# Patient Record
Sex: Male | Born: 1935 | Race: White | Hispanic: No | Marital: Married | Smoking: Never smoker
Health system: Southern US, Community
[De-identification: ages and names within clinical notes are randomized; demographics above are authoritative.]

## PROBLEM LIST (undated history)

## (undated) DIAGNOSIS — M545 Low back pain, unspecified: Secondary | ICD-10-CM

## (undated) DIAGNOSIS — E785 Hyperlipidemia, unspecified: Secondary | ICD-10-CM

## (undated) DIAGNOSIS — M47812 Spondylosis without myelopathy or radiculopathy, cervical region: Secondary | ICD-10-CM

## (undated) DIAGNOSIS — M47814 Spondylosis without myelopathy or radiculopathy, thoracic region: Secondary | ICD-10-CM

## (undated) DIAGNOSIS — N400 Enlarged prostate without lower urinary tract symptoms: Secondary | ICD-10-CM

## (undated) DIAGNOSIS — I10 Essential (primary) hypertension: Secondary | ICD-10-CM

## (undated) DIAGNOSIS — B91 Sequelae of poliomyelitis: Secondary | ICD-10-CM

## (undated) DIAGNOSIS — E039 Hypothyroidism, unspecified: Secondary | ICD-10-CM

## (undated) HISTORY — DX: Spondylosis without myelopathy or radiculopathy, thoracic region: M47.814

## (undated) HISTORY — DX: Low back pain: M54.5

## (undated) HISTORY — DX: Sequelae of poliomyelitis: B91

## (undated) HISTORY — DX: Low back pain, unspecified: M54.50

## (undated) HISTORY — DX: Spondylosis without myelopathy or radiculopathy, cervical region: M47.812

---

## 1998-10-06 ENCOUNTER — Emergency Department (HOSPITAL_COMMUNITY): Admission: EM | Admit: 1998-10-06 | Discharge: 1998-10-06 | Payer: Self-pay | Admitting: Emergency Medicine

## 1998-11-24 ENCOUNTER — Encounter (INDEPENDENT_AMBULATORY_CARE_PROVIDER_SITE_OTHER): Payer: Self-pay | Admitting: Specialist

## 1998-11-24 ENCOUNTER — Ambulatory Visit (HOSPITAL_COMMUNITY): Admission: RE | Admit: 1998-11-24 | Discharge: 1998-11-24 | Payer: Self-pay | Admitting: Urology

## 2009-02-10 ENCOUNTER — Encounter: Admission: RE | Admit: 2009-02-10 | Discharge: 2009-02-10 | Payer: Self-pay | Admitting: Internal Medicine

## 2009-08-05 ENCOUNTER — Ambulatory Visit (HOSPITAL_BASED_OUTPATIENT_CLINIC_OR_DEPARTMENT_OTHER): Admission: RE | Admit: 2009-08-05 | Discharge: 2009-08-06 | Payer: Self-pay | Admitting: Urology

## 2009-11-20 ENCOUNTER — Emergency Department (HOSPITAL_COMMUNITY): Admission: EM | Admit: 2009-11-20 | Discharge: 2009-11-20 | Payer: Self-pay | Admitting: Emergency Medicine

## 2009-12-19 ENCOUNTER — Ambulatory Visit (HOSPITAL_COMMUNITY): Admission: RE | Admit: 2009-12-19 | Discharge: 2009-12-20 | Payer: Self-pay | Admitting: Urology

## 2010-02-08 ENCOUNTER — Encounter: Admission: RE | Admit: 2010-02-08 | Discharge: 2010-02-08 | Payer: Self-pay | Admitting: Neurology

## 2010-05-12 ENCOUNTER — Encounter
Admission: RE | Admit: 2010-05-12 | Discharge: 2010-06-06 | Payer: Self-pay | Source: Home / Self Care | Attending: Physical Medicine & Rehabilitation | Admitting: Physical Medicine & Rehabilitation

## 2010-05-16 ENCOUNTER — Ambulatory Visit
Admission: RE | Admit: 2010-05-16 | Discharge: 2010-05-16 | Payer: Self-pay | Source: Home / Self Care | Attending: Physical Medicine & Rehabilitation | Admitting: Physical Medicine & Rehabilitation

## 2010-06-12 ENCOUNTER — Ambulatory Visit: Payer: MEDICARE | Attending: Physical Medicine & Rehabilitation

## 2010-06-12 ENCOUNTER — Encounter (HOSPITAL_BASED_OUTPATIENT_CLINIC_OR_DEPARTMENT_OTHER): Payer: MEDICARE | Admitting: Physical Medicine & Rehabilitation

## 2010-06-12 DIAGNOSIS — M47817 Spondylosis without myelopathy or radiculopathy, lumbosacral region: Secondary | ICD-10-CM | POA: Insufficient documentation

## 2010-06-12 DIAGNOSIS — Z79899 Other long term (current) drug therapy: Secondary | ICD-10-CM | POA: Insufficient documentation

## 2010-06-12 DIAGNOSIS — M545 Low back pain: Secondary | ICD-10-CM

## 2010-07-20 ENCOUNTER — Encounter (HOSPITAL_BASED_OUTPATIENT_CLINIC_OR_DEPARTMENT_OTHER): Payer: MEDICARE | Admitting: Physical Medicine & Rehabilitation

## 2010-07-20 ENCOUNTER — Encounter: Payer: MEDICARE | Attending: Physical Medicine & Rehabilitation

## 2010-07-20 DIAGNOSIS — R262 Difficulty in walking, not elsewhere classified: Secondary | ICD-10-CM | POA: Insufficient documentation

## 2010-07-20 DIAGNOSIS — M545 Low back pain, unspecified: Secondary | ICD-10-CM | POA: Insufficient documentation

## 2010-07-20 DIAGNOSIS — M47817 Spondylosis without myelopathy or radiculopathy, lumbosacral region: Secondary | ICD-10-CM

## 2010-07-21 LAB — CBC
HCT: 39.2 % (ref 39.0–52.0)
Hemoglobin: 13.3 g/dL (ref 13.0–17.0)
MCH: 30.9 pg (ref 26.0–34.0)
MCHC: 33.9 g/dL (ref 30.0–36.0)

## 2010-07-21 LAB — COMPREHENSIVE METABOLIC PANEL
ALT: 21 U/L (ref 0–53)
CO2: 33 mEq/L — ABNORMAL HIGH (ref 19–32)
Calcium: 9.6 mg/dL (ref 8.4–10.5)
Chloride: 106 mEq/L (ref 96–112)
Creatinine, Ser: 1.13 mg/dL (ref 0.4–1.5)
GFR calc non Af Amer: >60 mL/min (ref >60)
Glucose, Bld: 110 mg/dL — ABNORMAL HIGH (ref 70–99)
Sodium: 143 mEq/L (ref 135–145)
Total Bilirubin: 0.6 mg/dL (ref 0.3–1.2)

## 2010-07-24 ENCOUNTER — Ambulatory Visit: Payer: MEDICARE | Admitting: Physical Medicine & Rehabilitation

## 2010-07-26 LAB — POCT I-STAT 4, (NA,K, GLUC, HGB,HCT)
HCT: 39 % (ref 39.0–52.0)
Sodium: 141 mEq/L (ref 135–145)

## 2010-09-14 ENCOUNTER — Ambulatory Visit (HOSPITAL_BASED_OUTPATIENT_CLINIC_OR_DEPARTMENT_OTHER): Payer: MEDICARE | Admitting: Physical Medicine & Rehabilitation

## 2010-09-14 ENCOUNTER — Encounter: Payer: MEDICARE | Attending: Physical Medicine & Rehabilitation

## 2010-09-14 DIAGNOSIS — M545 Low back pain, unspecified: Secondary | ICD-10-CM | POA: Insufficient documentation

## 2010-09-14 DIAGNOSIS — R262 Difficulty in walking, not elsewhere classified: Secondary | ICD-10-CM | POA: Insufficient documentation

## 2010-09-14 DIAGNOSIS — M47817 Spondylosis without myelopathy or radiculopathy, lumbosacral region: Secondary | ICD-10-CM | POA: Insufficient documentation

## 2010-09-15 NOTE — Assessment & Plan Note (Signed)
This is a patient followed by Dr. Wynn Banker for low back pain.  He was last seen in March at which time he underwent bilateral L4-5 ramus injection with medial branch blocks.  The patient comes in today stating he has no more back pain.  He is doing well.  He does have a stiff leg and walks with somewhat of an altered gait due to, what he states what he thought was, polio as a child which caused him some stiffness in his legs but otherwise he is pain free.  The patient does not rate his pain at all today, has no problems with activities except for his gait disturbance.  He walks without assistance.  He climb steps and drives.  He works at a golf course most of the time.  Review of systems is notable for those difficulties described above and this is altered gait.  Physical exam; his blood pressure is 122/70, pulse 85, respirations 18, O2 sats 99 on room air.  His motor strength is 5/5 in lower extremities. Sensation is intact.  He is alert and oriented x3.  Constitutionally, he is within normal limits.  The patient states his only problem is that certainly when he stretches his right leg, he can feel a little bit of pain in his right buttock, but is not bothersome enough to investigate at this time.  IMPRESSION:  Lumbago with some history of radicular pain.  PLAN:  He will follow up with [place] in 3 months.  The patient is not on any medications at this time.  He states he uses some Pennsaid on his hands occasionally.  Other than that he is pain free and medicine free.  We will follow him up in 3 months.  Questions were encouraged and answered.     Cesar Alf L. Blima Dessert Electronically Signed    RLW/MedQ D:  09/14/2010 14:02:16  T:  09/15/2010 16:10:96  Job #:  [id number]

## 2010-12-05 ENCOUNTER — Ambulatory Visit (HOSPITAL_BASED_OUTPATIENT_CLINIC_OR_DEPARTMENT_OTHER): Payer: 59 | Admitting: Physical Medicine & Rehabilitation

## 2010-12-05 ENCOUNTER — Encounter: Payer: 59 | Attending: Physical Medicine & Rehabilitation

## 2010-12-05 DIAGNOSIS — M545 Low back pain, unspecified: Secondary | ICD-10-CM | POA: Insufficient documentation

## 2010-12-05 DIAGNOSIS — R262 Difficulty in walking, not elsewhere classified: Secondary | ICD-10-CM | POA: Insufficient documentation

## 2010-12-05 DIAGNOSIS — M47817 Spondylosis without myelopathy or radiculopathy, lumbosacral region: Secondary | ICD-10-CM | POA: Insufficient documentation

## 2010-12-06 NOTE — Assessment & Plan Note (Signed)
Account [id number].  This is a patient of Dr. Claudette Laws, on his scheduled day, I ended up seeing for him.  He has been seen for low back pain and has not had any new problems other than the fact that he states that he feels like he is slowing down and has some pain in his right leg from time to time, his average pain is 1.  He does not rate his activity level or indicate when the pain is better or worse.  He just states that he got an antibiotic shot in his right hip a couple of weeks ago and has had some leg pain since.  I explained to him that could be nerve aggravation.  He does walk with slight limp.  Functionality:  He is playing golf every day or least a few times a week.  REVIEW OF SYSTEMS:  Notable for difficulties described above, otherwise within normal limits.  His Oswestry score is 28.  PAST MEDICAL HISTORY, SOCIAL HISTORY, AND FAMILY HISTORY:  Unchanged.  PHYSICAL EXAMINATION:  VITAL SIGNS:  His blood pressure is 143/73.  His pulse is 73, respirations 18, and O2 sat is 97 on room air.  His motor strength 5/5 in iliopsoas, quadriceps.  Constitutionally, he is within normal limits.  He is alert and oriented x3.  ASSESSMENT:  Lumbago with history of radicular pain.  PLAN:  We will start on Mobic 15 mg one p.o. every other day p.r.n. #30, with one refill.  He knows to take this sparingly.  He will follow up with Dr. Wynn Banker for questionable injection in the next couple of months.  His questions were encouraged and answered.     Landen Knoedler L. Blima Dessert Electronically Signed    RLW/MedQ D:  12/05/2010 13:47:08  T:  12/06/2010 01:03:14  Job #:  [id number]

## 2010-12-08 ENCOUNTER — Ambulatory Visit: Payer: MEDICARE | Admitting: Physical Medicine & Rehabilitation

## 2011-01-30 ENCOUNTER — Ambulatory Visit: Payer: 59 | Admitting: Physical Medicine & Rehabilitation

## 2011-10-27 ENCOUNTER — Other Ambulatory Visit: Payer: Self-pay | Admitting: Physical Medicine & Rehabilitation

## 2014-07-08 ENCOUNTER — Emergency Department (HOSPITAL_COMMUNITY): Payer: Medicare Other

## 2014-07-08 ENCOUNTER — Encounter (HOSPITAL_COMMUNITY): Payer: Self-pay | Admitting: *Deleted

## 2014-07-08 ENCOUNTER — Emergency Department (HOSPITAL_COMMUNITY)
Admission: EM | Admit: 2014-07-08 | Discharge: 2014-07-09 | Disposition: A | Discharge status: Home / Self Care | Payer: Medicare Other | Attending: Emergency Medicine | Admitting: Emergency Medicine

## 2014-07-08 DIAGNOSIS — L03211 Cellulitis of face: Secondary | ICD-10-CM | POA: Insufficient documentation

## 2014-07-08 DIAGNOSIS — L0201 Cutaneous abscess of face: Secondary | ICD-10-CM | POA: Insufficient documentation

## 2014-07-08 DIAGNOSIS — M6281 Muscle weakness (generalized): Secondary | ICD-10-CM | POA: Diagnosis not present

## 2014-07-08 DIAGNOSIS — R531 Weakness: Secondary | ICD-10-CM | POA: Diagnosis not present

## 2014-07-08 DIAGNOSIS — R404 Transient alteration of awareness: Secondary | ICD-10-CM | POA: Diagnosis not present

## 2014-07-08 DIAGNOSIS — Z792 Long term (current) use of antibiotics: Secondary | ICD-10-CM | POA: Diagnosis not present

## 2014-07-08 DIAGNOSIS — R55 Syncope and collapse: Secondary | ICD-10-CM | POA: Diagnosis present

## 2014-07-08 DIAGNOSIS — M542 Cervicalgia: Secondary | ICD-10-CM | POA: Diagnosis not present

## 2014-07-08 DIAGNOSIS — Z79899 Other long term (current) drug therapy: Secondary | ICD-10-CM | POA: Diagnosis not present

## 2014-07-08 DIAGNOSIS — R42 Dizziness and giddiness: Secondary | ICD-10-CM | POA: Diagnosis not present

## 2014-07-08 DIAGNOSIS — R220 Localized swelling, mass and lump, head: Secondary | ICD-10-CM | POA: Diagnosis not present

## 2014-07-08 LAB — CBC WITH DIFFERENTIAL/PLATELET
BASOS ABS: 0 10*3/uL (ref 0.0–0.1)
BASOS PCT: 0 % (ref 0–1)
EOS PCT: 1 % (ref 0–5)
Eosinophils Absolute: 0.1 10*3/uL (ref 0.0–0.7)
HEMATOCRIT: 38.9 % — AB (ref 39.0–52.0)
HEMOGLOBIN: 12.8 g/dL — AB (ref 13.0–17.0)
Lymphocytes Relative: 16 % (ref 12–46)
Lymphs Abs: 1.6 10*3/uL (ref 0.7–4.0)
MCH: 30 pg (ref 26.0–34.0)
MCHC: 32.9 g/dL (ref 30.0–36.0)
MCV: 91.3 fL (ref 78.0–100.0)
MONO ABS: 0.7 10*3/uL (ref 0.1–1.0)
MONOS PCT: 7 % (ref 3–12)
Neutro Abs: 7.9 10*3/uL — ABNORMAL HIGH (ref 1.7–7.7)
Neutrophils Relative %: 76 % (ref 43–77)
Platelets: 120 10*3/uL — ABNORMAL LOW (ref 150–400)
RBC: 4.26 MIL/uL (ref 4.22–5.81)
RDW: 14.2 % (ref 11.5–15.5)
WBC: 10.2 10*3/uL (ref 4.0–10.5)

## 2014-07-08 LAB — I-STAT CHEM 8, ED
BUN: 20 mg/dL (ref 6–23)
CALCIUM ION: 1.14 mmol/L (ref 1.13–1.30)
CHLORIDE: 103 mmol/L (ref 96–112)
Creatinine, Ser: 1.1 mg/dL (ref 0.50–1.35)
GLUCOSE: 148 mg/dL — AB (ref 70–99)
HEMATOCRIT: 39 % (ref 39.0–52.0)
HEMOGLOBIN: 13.3 g/dL (ref 13.0–17.0)
Potassium: 3.8 mmol/L (ref 3.5–5.1)
Sodium: 142 mmol/L (ref 135–145)
TCO2: 23 mmol/L (ref 0–100)

## 2014-07-08 LAB — I-STAT TROPONIN, ED: Troponin i, poc: 0 ng/mL (ref 0.00–0.08)

## 2014-07-08 MED ORDER — IOHEXOL 300 MG/ML  SOLN
100.0000 mL | Freq: Once | INTRAMUSCULAR | Status: AC | PRN
Start: 2014-07-08 — End: 2014-07-08
  Administered 2014-07-08: 100 mL via INTRAVENOUS

## 2014-07-08 MED ORDER — CEPHALEXIN 500 MG PO CAPS: 500.0000 mg | ORAL_CAPSULE | Freq: Two times a day (BID) | ORAL | Status: DC

## 2014-07-08 NOTE — ED Notes (Signed)
Pt arrives to the ER via EMS for complaints of abscess to jaw and near syncope; pt was on his way in to the clinic to be evaluated for the abscess and had sudden onset of dizziness; pt was assisted by staff to chair and they called EMS; Clinic reported to EMS that pt BP was 70/30 when they evaluated him but upon EMS arrival BP was 138/ 78; pt alert and oriented; pt states that the abscess to jaw began Sat and has gotten progressively worse; pt c/o pain and discomfort to jaw radiating to neck

## 2014-07-08 NOTE — ED Notes (Signed)
Bed: WA03 Expected date:  Expected time:  Means of arrival:  Comments: EMS/abscess/syncopal episode

## 2014-07-08 NOTE — Discharge Instructions (Signed)
Cellulitis Cellulitis is an infection of the skin and the tissue beneath it. The infected area is usually red and tender. Cellulitis occurs most often in the arms and lower legs.  CAUSES  Cellulitis is caused by bacteria that enter the skin through cracks or cuts in the skin. The most common types of bacteria that cause cellulitis are staphylococci and streptococci. SIGNS AND SYMPTOMS   Redness and warmth.  Swelling.  Tenderness or pain.  Fever. DIAGNOSIS  Your health care provider can usually determine what is wrong based on a physical exam. Blood tests may also be done. TREATMENT  Treatment usually involves taking an antibiotic medicine. HOME CARE INSTRUCTIONS   Take your antibiotic medicine as directed by your health care provider. Finish the antibiotic even if you start to feel better.  Keep the infected arm or leg elevated to reduce swelling.  Apply a warm cloth to the affected area up to 4 times per day to relieve pain.  Take medicines only as directed by your health care provider.  Keep all follow-up visits as directed by your health care provider. SEEK MEDICAL CARE IF:   You notice red streaks coming from the infected area.  Your red area gets larger or turns dark in color.  Your bone or joint underneath the infected area becomes painful after the skin has healed.  Your infection returns in the same area or another area.  You notice a swollen bump in the infected area.  You develop new symptoms.  You have a fever. SEEK IMMEDIATE MEDICAL CARE IF:   You feel very sleepy.  You develop vomiting or diarrhea.  You have a general ill feeling (malaise) with muscle aches and pains. MAKE SURE YOU:   Understand these instructions.  Will watch your condition.  Will get help right away if you are not doing well or get worse. Document Released: 01/31/2005 Document Revised: 09/07/2013 Document Reviewed: 07/09/2011 [facility] Patient Information 2015 [employer], [place].  This information is not intended to replace advice given to you by your health care provider. Make sure you discuss any questions you have with your health care provider.  Apply warm compresses 3-4 times/day. Take antibiotics as prescribed and f/u with your primary care physician or ENT referral.

## 2014-07-08 NOTE — ED Provider Notes (Signed)
CSN: [id number]     Arrival date & time 07/08/14  2004 History   First MD Initiated Contact with Patient 07/08/14 2017     Chief Complaint  Patient presents with  . Abscess  . Near Syncope     (Consider location/radiation/quality/duration/timing/severity/associated sxs/prior Treatment) Patient is a 80 y.o. male presenting with abscess and near-syncope. The history is provided by the patient and the spouse. No language interpreter was used.  Abscess Associated symptoms: no fever, no headaches, no nausea and no vomiting   Near Syncope Associated symptoms include weakness. Pertinent negatives include no abdominal pain, chest pain, chills, coughing, fever, headaches, nausea, numbness or vomiting.  Mr. Deupree presents for right jaw pain and swelling that began gradually four days ago. While on the way to get his jaw treated, the patient felt weak and slumped to the ground near the car just prior to arrival.  He denies fever, chills, chest pain, dizziness, loss of consciousness, syncope, nausea, vomiting, or abdominal pain. He has applied warm compresses to the jaw area without relief.   Past Medical History  Diagnosis Date  . Late effects of acute poliomyelitis   . Lumbago   . Thoracic spondylosis without myelopathy   . Cervical spondylosis without myelopathy    History reviewed. No pertinent past surgical history. No family history on file. History  Substance Use Topics  . Smoking status: Never Smoker   . Smokeless tobacco: Not on file  . Alcohol Use: No    Review of Systems  Constitutional: Negative for fever and chills.  Respiratory: Negative for cough and chest tightness.   Cardiovascular: Positive for near-syncope. Negative for chest pain.  Gastrointestinal: Negative for nausea, vomiting and abdominal pain.  Neurological: Positive for weakness. Negative for dizziness, seizures, syncope, speech difficulty, light-headedness, numbness and headaches.  All other systems reviewed and  are negative.     Allergies  Review of patient's allergies indicates no known allergies.  Home Medications   Prior to Admission medications   Medication Sig Start Date End Date Taking? Authorizing Provider  diphenhydramine-acetaminophen (TYLENOL PM) 25-500 MG TABS Take 1 tablet by mouth at bedtime as needed (sleep).   Yes Historical Provider, MD  levothyroxine (SYNTHROID, LEVOTHROID) 100 MCG tablet Take 100 mcg by mouth daily.    Yes Historical Provider, MD  nitrofurantoin, macrocrystal-monohydrate, (MACROBID) 100 MG capsule Take 100 mg by mouth daily as needed (prevent infection).   Yes Historical Provider, MD  terazosin (HYTRIN) 2 MG capsule Take 2 mg by mouth daily.   Yes Historical Provider, MD  cephALEXin (KEFLEX) 500 MG capsule Take 1 capsule (500 mg total) by mouth 2 (two) times daily. 07/08/14   Darcey Demma Patel-Mills, PA-C   BP 144/77 mmHg  Pulse 92  Temp(Src) 98.2 F (36.8 C) (Oral)  Resp 17  SpO2 98% Physical Exam  Constitutional: He is oriented to person, place, and time. He appears well-developed and well-nourished.  HENT:  Head: Normocephalic and atraumatic.  Eyes: Conjunctivae are normal. Pupils are equal, round, and reactive to light.  Neck: Normal range of motion. Neck supple.  Cardiovascular: Normal rate, regular rhythm and normal heart sounds.   Pulmonary/Chest: Effort normal and breath sounds normal. He has no wheezes.  Abdominal: Soft. There is no tenderness.  Musculoskeletal: Normal range of motion.  Neurological: He is alert and oriented to person, place, and time. He has normal strength. No cranial nerve deficit or sensory deficit.  Skin: Skin is warm and dry.  He has an 8cm in diameter right  mandibular fluctuant, erythematous, warm, and tender area. There is no drainage from the area. He is able to open his mouth without difficulty.   Nursing note and vitals reviewed.   ED Course  Procedures (including critical care time) Labs Review Labs Reviewed  CBC  WITH DIFFERENTIAL/PLATELET - Abnormal; Notable for the following:    Hemoglobin 12.8 (*)    HCT 38.9 (*)    Platelets 120 (*)    Neutro Abs 7.9 (*)    All other components within normal limits  I-STAT CHEM 8, ED - Abnormal; Notable for the following:    Glucose, Bld 148 (*)    All other components within normal limits  I-STAT TROPOININ, ED    Imaging Review Dg Chest 2 View  07/08/2014   CLINICAL DATA:  Dizziness.  EXAM: CHEST  2 VIEW  COMPARISON:  August 05, 2009.  FINDINGS: The heart size and mediastinal contours are within normal limits. Both lungs are clear. No pneumothorax or pleural effusion is noted. Degenerative disc disease is seen in the lower thoracic spine.  IMPRESSION: No acute cardiopulmonary abnormality seen.   Electronically Signed   By: Lupita RaiderJames  Green Jr, M.D.   On: 07/08/2014 22:00   Ct Soft Tissue Neck W Contrast  07/08/2014   CLINICAL DATA:  Right neck pain and swelling  EXAM: CT NECK WITH CONTRAST  TECHNIQUE: Multidetector CT imaging of the neck was performed using the standard protocol following the bolus administration of intravenous contrast.  CONTRAST:  100mL OMNIPAQUE IOHEXOL 300 MG/ML  SOLN  COMPARISON:  None.  FINDINGS: There is considerable skin thickening and subcutaneous edema overlying the right parotid gland best seen on image number 37 of series 3. Some inflammatory changes in the fascia over the margin of the parotid gland is seen. The parotid gland is otherwise unaffected. There is a small area of decreased attenuation identified which measures approximately 14 mm just below the skin surface consistent with a small subcutaneous abscess.  Pharynx and larynx: Within normal limits. The airway is widely patent.  Salivary glands: The parotid and submandibular glands are well visualized. Some facetal thickening is noted over the right parotid gland related to the underlying scan inflammatory process.  Thyroid: Less than 1 cm hypodensity within the left lobe. This likely  represents a small nodule.  Lymph nodes: No significant lymphadenopathy is identified.  Vascular: The jugular veins and carotid arteries are within normal limits as visualized. The bifurcations of the carotid arteries are patent.  Limited intracranial: Within normal limits.  Visualized orbits: Within normal limits.  Mastoids and visualized paranasal sinuses: Within normal limits.  Skeleton: Degenerative changes of the cervical spine are noted. No acute bony abnormality is seen.  Upper chest: The visualized lung apices are within normal limits.  IMPRESSION: Changes consistent with cellulitis over the right parotid gland with an underlying small subcutaneous abscess as described.  No other acute abnormality is seen.   Electronically Signed   By: Alcide CleverMark  Lukens M.D.   On: 07/08/2014 22:35     EKG Interpretation None     Date: 07/08/14 Rate: 90 Rhythm: Normal sinus  QRS: left Intervals: Normal ST/T wave abnormalities: Non specific ST/T changes   MDM   Final diagnoses:  Cellulitis and abscess of face  Generalized weakness  CBC and BMP are normal. Troponin negative. EKG shows normal rate and rhythm. CXR shows no acute finding. Dr. Jeraldine LootsLockwood and I agreed that the patient does not need a CT head.  He has no neurological deficits,  no syncope, no loss of consciousness, normal strength and cranial nerves III-XII are intact.   Due to the location and size of the abscess I ordered a CT soft tissue neck which shows cellulitis over the right parotid gland with an underlying small subcutaneous abscess.  Dr. Jeraldine Loots and I agree that this should not be drained and that antibiotics are sufficient.  23:15 Patient states he is feeling better and has no chest pain or weakness and would like to go home. He has an appointment with his PCP on Monday and I gave him an ENT referral to f/u for his abscess/cellulitis. Discussed applying warm compresses multiples times of day. Discussed return precautions such as fever,  weakness, chest pain, or shortness of breath.    Catha Gosselin, PA-C 07/09/14 1314  Gerhard Munch, MD 07/10/14 [phone]

## 2014-07-09 NOTE — ED Provider Notes (Signed)
Date: 07/09/2014  Rate: 90  Rhythm: normal sinus rhythm  QRS Axis: left  Intervals: normal  ST/T Wave abnormalities: nonspecific ST/T changes  Conduction Disutrbances:none  Narrative Interpretation:   Old EKG Reviewed: No significant change from 12/13/2009 interpreted by me  I performed EKG interpretation only. I had no interaction with this patient   Doug SouSam Chealsea Paske, MD 07/09/14 [phone]

## 2014-07-12 DIAGNOSIS — L03211 Cellulitis of face: Secondary | ICD-10-CM | POA: Diagnosis not present

## 2014-07-19 DIAGNOSIS — R351 Nocturia: Secondary | ICD-10-CM | POA: Diagnosis not present

## 2014-07-19 DIAGNOSIS — G14 Postpolio syndrome: Secondary | ICD-10-CM | POA: Diagnosis not present

## 2014-07-19 DIAGNOSIS — R350 Frequency of micturition: Secondary | ICD-10-CM | POA: Diagnosis not present

## 2014-07-19 DIAGNOSIS — N320 Bladder-neck obstruction: Secondary | ICD-10-CM | POA: Diagnosis not present

## 2015-01-31 DIAGNOSIS — E039 Hypothyroidism, unspecified: Secondary | ICD-10-CM | POA: Diagnosis not present

## 2015-01-31 DIAGNOSIS — L309 Dermatitis, unspecified: Secondary | ICD-10-CM | POA: Diagnosis not present

## 2015-01-31 DIAGNOSIS — R32 Unspecified urinary incontinence: Secondary | ICD-10-CM | POA: Diagnosis not present

## 2015-01-31 DIAGNOSIS — Z79899 Other long term (current) drug therapy: Secondary | ICD-10-CM | POA: Diagnosis not present

## 2015-01-31 DIAGNOSIS — D696 Thrombocytopenia, unspecified: Secondary | ICD-10-CM | POA: Diagnosis not present

## 2015-01-31 DIAGNOSIS — E780 Pure hypercholesterolemia: Secondary | ICD-10-CM | POA: Diagnosis not present

## 2015-01-31 DIAGNOSIS — R3914 Feeling of incomplete bladder emptying: Secondary | ICD-10-CM | POA: Diagnosis not present

## 2015-01-31 DIAGNOSIS — D81818 Other biotin-dependent carboxylase deficiency: Secondary | ICD-10-CM | POA: Diagnosis not present

## 2015-01-31 DIAGNOSIS — E559 Vitamin D deficiency, unspecified: Secondary | ICD-10-CM | POA: Diagnosis not present

## 2015-01-31 DIAGNOSIS — N402 Nodular prostate without lower urinary tract symptoms: Secondary | ICD-10-CM | POA: Diagnosis not present

## 2015-01-31 DIAGNOSIS — R1310 Dysphagia, unspecified: Secondary | ICD-10-CM | POA: Diagnosis not present

## 2015-01-31 DIAGNOSIS — J309 Allergic rhinitis, unspecified: Secondary | ICD-10-CM | POA: Diagnosis not present

## 2015-02-02 DIAGNOSIS — E538 Deficiency of other specified B group vitamins: Secondary | ICD-10-CM | POA: Diagnosis not present

## 2015-02-09 DIAGNOSIS — E538 Deficiency of other specified B group vitamins: Secondary | ICD-10-CM | POA: Diagnosis not present

## 2015-02-14 DIAGNOSIS — Z23 Encounter for immunization: Secondary | ICD-10-CM | POA: Diagnosis not present

## 2015-02-14 DIAGNOSIS — E039 Hypothyroidism, unspecified: Secondary | ICD-10-CM | POA: Diagnosis not present

## 2015-02-14 DIAGNOSIS — E559 Vitamin D deficiency, unspecified: Secondary | ICD-10-CM | POA: Diagnosis not present

## 2015-02-14 DIAGNOSIS — E538 Deficiency of other specified B group vitamins: Secondary | ICD-10-CM | POA: Diagnosis not present

## 2015-02-14 DIAGNOSIS — R5382 Chronic fatigue, unspecified: Secondary | ICD-10-CM | POA: Diagnosis not present

## 2015-02-24 DIAGNOSIS — E538 Deficiency of other specified B group vitamins: Secondary | ICD-10-CM | POA: Diagnosis not present

## 2015-03-28 DIAGNOSIS — R5382 Chronic fatigue, unspecified: Secondary | ICD-10-CM | POA: Diagnosis not present

## 2015-10-01 IMAGING — CT CT NECK W/ CM
2 of 3 series · 8 of 14 positions shown, 9 images · IV contrast (OMNIPAQUE 300)
Comparison: None.

CLINICAL DATA: Right neck pain and swelling

EXAM:
CT NECK WITH CONTRAST
TECHNIQUE: Multidetector CT imaging of the neck was performed using the
standard protocol following the bolus administration of intravenous
contrast.
CONTRAST:  100mL OMNIPAQUE IOHEXOL 300 MG/ML  SOLN

[Series 3: neck with st · axial · 0.37mm/px · z∈[-330,-204]mm · 4 of 107 slices shown]
[im 22/107  bone]
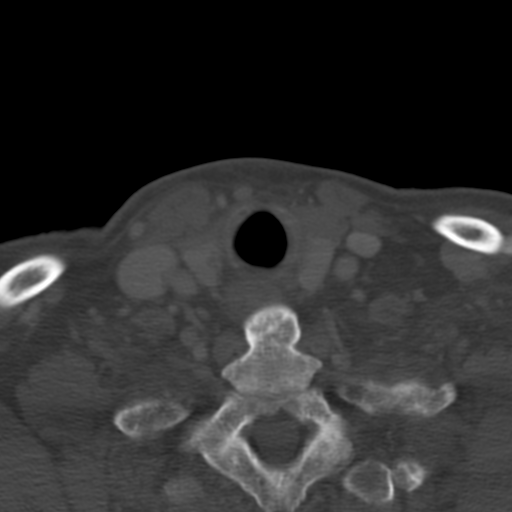
[im 43/107  bone]
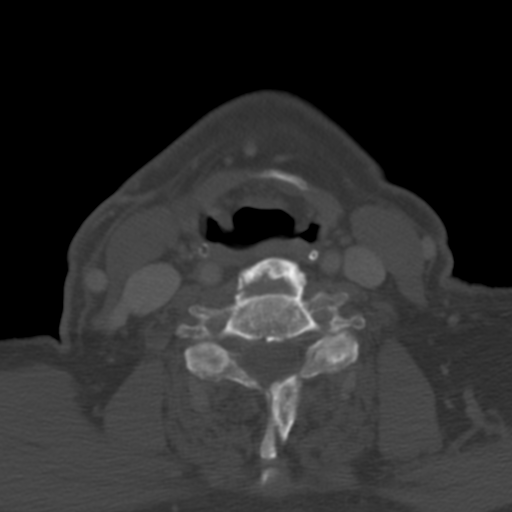
[im 64/107  bone]
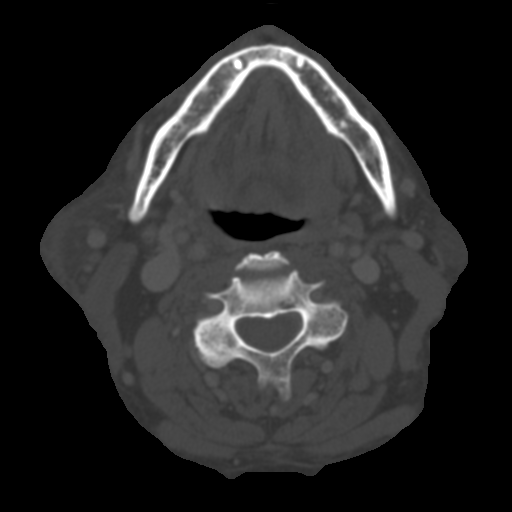
[im 85/107  bone]
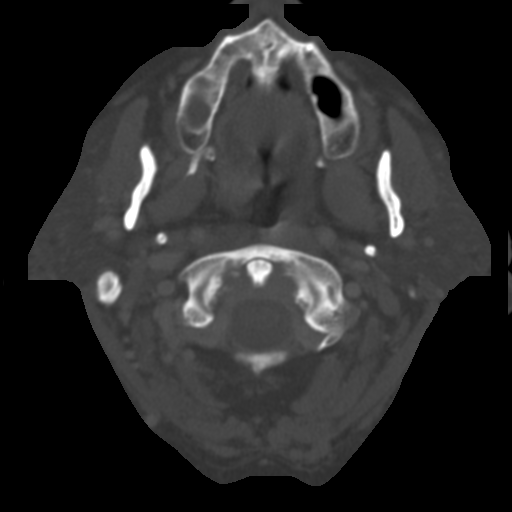

[Series 6: axial recons · axial · 0.39mm/px · z∈[-352,-234]mm · 4 of 103 slices shown, 5 images]
[im 21/103  soft-tissue]
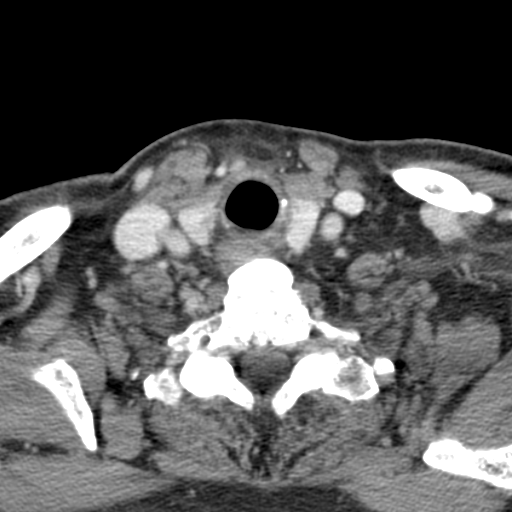
[im 21/103  bone]
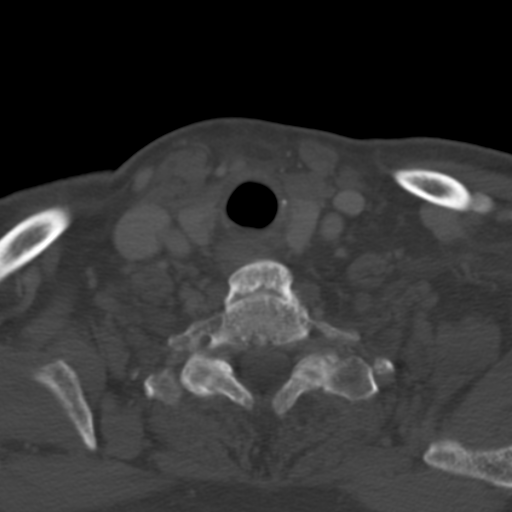
[im 41/103  bone]
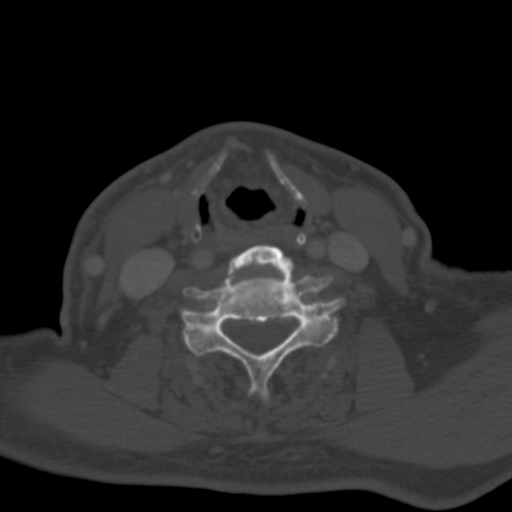
[im 62/103  bone]
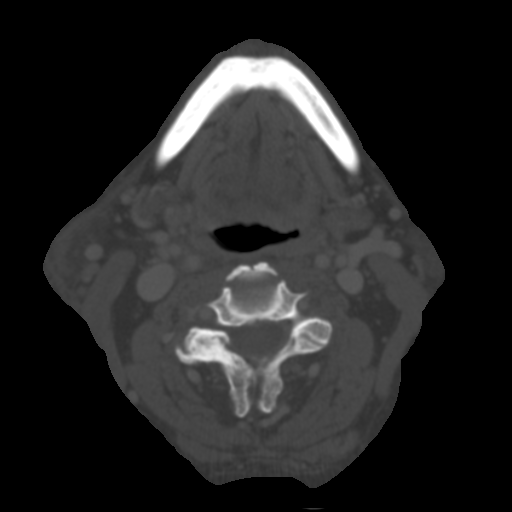
[im 82/103  bone]
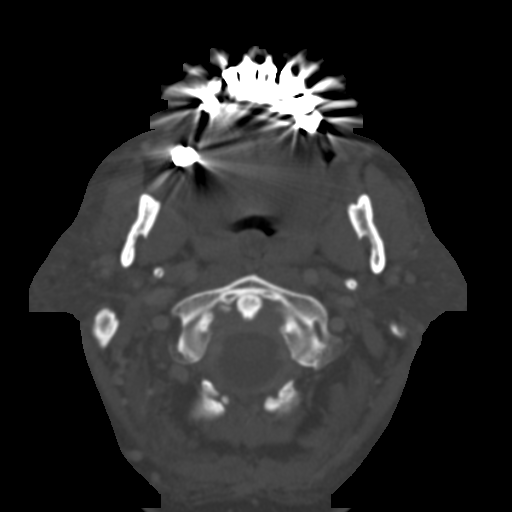

[8 of 14 positions shown; findings below may reference images not displayed]

FINDINGS: There is considerable skin thickening and subcutaneous edema
overlying the right parotid gland best seen on image number 37 of
series 3. Some inflammatory changes in the fascia over the margin of
the parotid gland is seen. The parotid gland is otherwise
unaffected. There is a small area of decreased attenuation
identified which measures approximately 14 mm just below the skin
surface consistent with a small subcutaneous abscess.

Pharynx and larynx: Within normal limits. The airway is widely
patent.

Salivary glands: The parotid and submandibular glands are well
visualized. Some facetal thickening is noted over the right parotid
gland related to the underlying scan inflammatory process.

Thyroid: Less than 1 cm hypodensity within the left lobe. This
likely represents a small nodule.

Lymph nodes: No significant lymphadenopathy is identified.

Vascular: The jugular veins and carotid arteries are within normal
limits as visualized. The bifurcations of the carotid arteries are
patent.

Limited intracranial: Within normal limits.

Visualized orbits: Within normal limits.

Mastoids and visualized paranasal sinuses: Within normal limits.

Skeleton: Degenerative changes of the cervical spine are noted. No
acute bony abnormality is seen.

Upper chest: The visualized lung apices are within normal limits.
IMPRESSION: Changes consistent with cellulitis over the right parotid gland with
an underlying small subcutaneous abscess as described.

No other acute abnormality is seen.

## 2020-10-27 DIAGNOSIS — Z23 Encounter for immunization: Secondary | ICD-10-CM | POA: Diagnosis not present

## 2020-11-10 DIAGNOSIS — E559 Vitamin D deficiency, unspecified: Secondary | ICD-10-CM | POA: Diagnosis not present

## 2020-11-10 DIAGNOSIS — D696 Thrombocytopenia, unspecified: Secondary | ICD-10-CM | POA: Diagnosis not present

## 2020-11-10 DIAGNOSIS — E538 Deficiency of other specified B group vitamins: Secondary | ICD-10-CM | POA: Diagnosis not present

## 2020-11-10 DIAGNOSIS — R350 Frequency of micturition: Secondary | ICD-10-CM | POA: Diagnosis not present

## 2020-11-10 DIAGNOSIS — R600 Localized edema: Secondary | ICD-10-CM | POA: Diagnosis not present

## 2020-11-10 DIAGNOSIS — Z1389 Encounter for screening for other disorder: Secondary | ICD-10-CM | POA: Diagnosis not present

## 2020-11-10 DIAGNOSIS — R1310 Dysphagia, unspecified: Secondary | ICD-10-CM | POA: Diagnosis not present

## 2020-11-10 DIAGNOSIS — E039 Hypothyroidism, unspecified: Secondary | ICD-10-CM | POA: Diagnosis not present

## 2020-11-10 DIAGNOSIS — Z0000 Encounter for general adult medical examination without abnormal findings: Secondary | ICD-10-CM | POA: Diagnosis not present

## 2020-11-10 DIAGNOSIS — F17200 Nicotine dependence, unspecified, uncomplicated: Secondary | ICD-10-CM | POA: Diagnosis not present

## 2020-11-10 DIAGNOSIS — R269 Unspecified abnormalities of gait and mobility: Secondary | ICD-10-CM | POA: Diagnosis not present

## 2020-11-10 DIAGNOSIS — E782 Mixed hyperlipidemia: Secondary | ICD-10-CM | POA: Diagnosis not present

## 2021-01-10 DIAGNOSIS — R600 Localized edema: Secondary | ICD-10-CM | POA: Diagnosis not present

## 2021-01-10 DIAGNOSIS — E782 Mixed hyperlipidemia: Secondary | ICD-10-CM | POA: Diagnosis not present

## 2021-01-10 DIAGNOSIS — D649 Anemia, unspecified: Secondary | ICD-10-CM | POA: Diagnosis not present

## 2021-01-10 DIAGNOSIS — E538 Deficiency of other specified B group vitamins: Secondary | ICD-10-CM | POA: Diagnosis not present

## 2021-01-10 DIAGNOSIS — N390 Urinary tract infection, site not specified: Secondary | ICD-10-CM | POA: Diagnosis not present

## 2021-01-10 DIAGNOSIS — Z23 Encounter for immunization: Secondary | ICD-10-CM | POA: Diagnosis not present

## 2021-01-10 DIAGNOSIS — R350 Frequency of micturition: Secondary | ICD-10-CM | POA: Diagnosis not present

## 2021-01-10 DIAGNOSIS — E559 Vitamin D deficiency, unspecified: Secondary | ICD-10-CM | POA: Diagnosis not present

## 2021-01-10 DIAGNOSIS — I10 Essential (primary) hypertension: Secondary | ICD-10-CM | POA: Diagnosis not present

## 2021-01-10 DIAGNOSIS — D696 Thrombocytopenia, unspecified: Secondary | ICD-10-CM | POA: Diagnosis not present

## 2021-01-10 DIAGNOSIS — E039 Hypothyroidism, unspecified: Secondary | ICD-10-CM | POA: Diagnosis not present

## 2021-04-12 DIAGNOSIS — E782 Mixed hyperlipidemia: Secondary | ICD-10-CM | POA: Diagnosis not present

## 2021-04-12 DIAGNOSIS — E039 Hypothyroidism, unspecified: Secondary | ICD-10-CM | POA: Diagnosis not present

## 2021-04-12 DIAGNOSIS — E559 Vitamin D deficiency, unspecified: Secondary | ICD-10-CM | POA: Diagnosis not present

## 2021-04-12 DIAGNOSIS — E538 Deficiency of other specified B group vitamins: Secondary | ICD-10-CM | POA: Diagnosis not present

## 2021-04-12 DIAGNOSIS — N390 Urinary tract infection, site not specified: Secondary | ICD-10-CM | POA: Diagnosis not present

## 2021-04-12 DIAGNOSIS — D696 Thrombocytopenia, unspecified: Secondary | ICD-10-CM | POA: Diagnosis not present

## 2021-04-12 DIAGNOSIS — D649 Anemia, unspecified: Secondary | ICD-10-CM | POA: Diagnosis not present

## 2021-04-12 DIAGNOSIS — R600 Localized edema: Secondary | ICD-10-CM | POA: Diagnosis not present

## 2021-10-11 ENCOUNTER — Other Ambulatory Visit: Payer: Self-pay

## 2021-10-11 ENCOUNTER — Inpatient Hospital Stay (HOSPITAL_COMMUNITY)
Admission: EM | Admit: 2021-10-11 | Discharge: 2021-10-14 | DRG: 871 | Disposition: A | Discharge status: Home Health | Payer: Medicare Other | Attending: Internal Medicine | Admitting: Internal Medicine

## 2021-10-11 ENCOUNTER — Emergency Department (HOSPITAL_COMMUNITY): Payer: Medicare Other

## 2021-10-11 ENCOUNTER — Encounter (HOSPITAL_COMMUNITY): Payer: Self-pay | Admitting: Emergency Medicine

## 2021-10-11 DIAGNOSIS — Z91148 Patient's other noncompliance with medication regimen for other reason: Secondary | ICD-10-CM | POA: Diagnosis not present

## 2021-10-11 DIAGNOSIS — D696 Thrombocytopenia, unspecified: Secondary | ICD-10-CM | POA: Diagnosis not present

## 2021-10-11 DIAGNOSIS — G9341 Metabolic encephalopathy: Secondary | ICD-10-CM | POA: Diagnosis not present

## 2021-10-11 DIAGNOSIS — A419 Sepsis, unspecified organism: Principal | ICD-10-CM | POA: Diagnosis present

## 2021-10-11 DIAGNOSIS — I10 Essential (primary) hypertension: Secondary | ICD-10-CM | POA: Diagnosis not present

## 2021-10-11 DIAGNOSIS — E785 Hyperlipidemia, unspecified: Secondary | ICD-10-CM

## 2021-10-11 DIAGNOSIS — E039 Hypothyroidism, unspecified: Secondary | ICD-10-CM | POA: Diagnosis not present

## 2021-10-11 DIAGNOSIS — R509 Fever, unspecified: Secondary | ICD-10-CM | POA: Diagnosis not present

## 2021-10-11 DIAGNOSIS — Z79890 Hormone replacement therapy (postmenopausal): Secondary | ICD-10-CM

## 2021-10-11 DIAGNOSIS — R197 Diarrhea, unspecified: Secondary | ICD-10-CM

## 2021-10-11 DIAGNOSIS — R0602 Shortness of breath: Secondary | ICD-10-CM | POA: Diagnosis not present

## 2021-10-11 DIAGNOSIS — E872 Acidosis, unspecified: Secondary | ICD-10-CM | POA: Diagnosis not present

## 2021-10-11 DIAGNOSIS — R9431 Abnormal electrocardiogram [ECG] [EKG]: Secondary | ICD-10-CM | POA: Diagnosis not present

## 2021-10-11 DIAGNOSIS — D649 Anemia, unspecified: Secondary | ICD-10-CM | POA: Diagnosis not present

## 2021-10-11 DIAGNOSIS — R6520 Severe sepsis without septic shock: Secondary | ICD-10-CM | POA: Diagnosis present

## 2021-10-11 DIAGNOSIS — Z20822 Contact with and (suspected) exposure to covid-19: Secondary | ICD-10-CM | POA: Diagnosis present

## 2021-10-11 DIAGNOSIS — Z79899 Other long term (current) drug therapy: Secondary | ICD-10-CM | POA: Diagnosis not present

## 2021-10-11 DIAGNOSIS — B91 Sequelae of poliomyelitis: Secondary | ICD-10-CM

## 2021-10-11 DIAGNOSIS — B9620 Unspecified Escherichia coli [E. coli] as the cause of diseases classified elsewhere: Secondary | ICD-10-CM | POA: Diagnosis present

## 2021-10-11 DIAGNOSIS — R410 Disorientation, unspecified: Secondary | ICD-10-CM | POA: Diagnosis not present

## 2021-10-11 DIAGNOSIS — R6889 Other general symptoms and signs: Secondary | ICD-10-CM | POA: Diagnosis not present

## 2021-10-11 DIAGNOSIS — Z743 Need for continuous supervision: Secondary | ICD-10-CM | POA: Diagnosis not present

## 2021-10-11 DIAGNOSIS — I499 Cardiac arrhythmia, unspecified: Secondary | ICD-10-CM | POA: Diagnosis not present

## 2021-10-11 DIAGNOSIS — N390 Urinary tract infection, site not specified: Secondary | ICD-10-CM | POA: Diagnosis not present

## 2021-10-11 LAB — CBC WITH DIFFERENTIAL/PLATELET
Abs Immature Granulocytes: 0.07 10*3/uL (ref 0.00–0.07)
Basophils Absolute: 0 10*3/uL (ref 0.0–0.1)
Basophils Relative: 0 %
Eosinophils Absolute: 0 10*3/uL (ref 0.0–0.5)
Eosinophils Relative: 0 %
HCT: 38.9 % — ABNORMAL LOW (ref 39.0–52.0)
Hemoglobin: 12.2 g/dL — ABNORMAL LOW (ref 13.0–17.0)
Immature Granulocytes: 1 %
Lymphocytes Relative: 6 %
Lymphs Abs: 0.9 10*3/uL (ref 0.7–4.0)
MCH: 29.6 pg (ref 26.0–34.0)
MCHC: 31.4 g/dL (ref 30.0–36.0)
MCV: 94.4 fL (ref 80.0–100.0)
Monocytes Absolute: 1.1 10*3/uL — ABNORMAL HIGH (ref 0.1–1.0)
Monocytes Relative: 7 %
Neutro Abs: 12.8 10*3/uL — ABNORMAL HIGH (ref 1.7–7.7)
Neutrophils Relative %: 86 %
Platelets: 132 10*3/uL — ABNORMAL LOW (ref 150–400)
RBC: 4.12 MIL/uL — ABNORMAL LOW (ref 4.22–5.81)
RDW: 14.7 % (ref 11.5–15.5)
WBC: 14.8 10*3/uL — ABNORMAL HIGH (ref 4.0–10.5)
nRBC: 0 % (ref 0.0–0.2)

## 2021-10-11 LAB — LACTIC ACID, PLASMA: Lactic Acid, Venous: 2.4 mmol/L (ref 0.5–1.9)

## 2021-10-11 LAB — COMPREHENSIVE METABOLIC PANEL
ALT: 16 U/L (ref 0–44)
AST: 17 U/L (ref 15–41)
Albumin: 4 g/dL (ref 3.5–5.0)
Alkaline Phosphatase: 52 U/L (ref 38–126)
Anion gap: 13 (ref 5–15)
BUN: 18 mg/dL (ref 8–23)
CO2: 26 mmol/L (ref 22–32)
Calcium: 9.5 mg/dL (ref 8.9–10.3)
Chloride: 100 mmol/L (ref 98–111)
Creatinine, Ser: 1.28 mg/dL — ABNORMAL HIGH (ref 0.61–1.24)
GFR, Estimated: 55 mL/min — ABNORMAL LOW (ref >60)
Glucose, Bld: 167 mg/dL — ABNORMAL HIGH (ref 70–99)
Potassium: 4.4 mmol/L (ref 3.5–5.1)
Sodium: 139 mmol/L (ref 135–145)
Total Bilirubin: 0.6 mg/dL (ref 0.3–1.2)
Total Protein: 7.7 g/dL (ref 6.5–8.1)

## 2021-10-11 LAB — URINALYSIS, ROUTINE W REFLEX MICROSCOPIC
Bilirubin Urine: NEGATIVE
Glucose, UA: NEGATIVE mg/dL
Ketones, ur: NEGATIVE mg/dL
Nitrite: POSITIVE — AB
Protein, ur: NEGATIVE mg/dL
Specific Gravity, Urine: 1.015 (ref 1.005–1.030)
WBC, UA: >50 WBC/hpf — ABNORMAL HIGH (ref 0–5)
pH: 5 (ref 5.0–8.0)

## 2021-10-11 LAB — RESP PANEL BY RT-PCR (FLU A&B, COVID) ARPGX2
Influenza A by PCR: NEGATIVE
Influenza B by PCR: NEGATIVE
SARS Coronavirus 2 by RT PCR: NEGATIVE

## 2021-10-11 LAB — APTT: aPTT: 28 seconds (ref 24–36)

## 2021-10-11 LAB — PROTIME-INR
INR: 1.1 (ref 0.8–1.2)
Prothrombin Time: 13.6 seconds (ref 11.4–15.2)

## 2021-10-11 MED ORDER — LACTATED RINGERS IV BOLUS (SEPSIS)
1000.0000 mL | Freq: Once | INTRAVENOUS | Status: AC
  Administered 2021-10-11: 1000 mL via INTRAVENOUS

## 2021-10-11 MED ORDER — SODIUM CHLORIDE 0.9 % IV SOLN: 2.0000 g | Freq: Once | INTRAVENOUS | Status: DC

## 2021-10-11 MED ORDER — VANCOMYCIN HCL 2000 MG/400ML IV SOLN
2000.0000 mg | Freq: Once | INTRAVENOUS | Status: AC
  Administered 2021-10-11: 2000 mg via INTRAVENOUS
  Filled 2021-10-11: qty 400

## 2021-10-11 MED ORDER — VANCOMYCIN HCL 1250 MG/250ML IV SOLN: 1250.0000 mg | INTRAVENOUS | Status: DC

## 2021-10-11 MED ORDER — METRONIDAZOLE 500 MG/100ML IV SOLN
500.0000 mg | Freq: Once | INTRAVENOUS | Status: AC
  Administered 2021-10-11: 500 mg via INTRAVENOUS
  Filled 2021-10-11: qty 100

## 2021-10-11 MED ORDER — LACTATED RINGERS IV BOLUS (SEPSIS)
500.0000 mL | Freq: Once | INTRAVENOUS | Status: AC
  Administered 2021-10-11: 500 mL via INTRAVENOUS

## 2021-10-11 MED ORDER — ACETAMINOPHEN 500 MG PO TABS
1000.0000 mg | ORAL_TABLET | Freq: Once | ORAL | Status: AC
  Administered 2021-10-11: 1000 mg via ORAL
  Filled 2021-10-11: qty 2

## 2021-10-11 MED ORDER — VANCOMYCIN HCL IN DEXTROSE 1-5 GM/200ML-% IV SOLN: 1000.0000 mg | Freq: Once | INTRAVENOUS | Status: DC

## 2021-10-11 MED ORDER — SODIUM CHLORIDE 0.9 % IV SOLN
2.0000 g | Freq: Two times a day (BID) | INTRAVENOUS | Status: DC
  Administered 2021-10-11: 2 g via INTRAVENOUS
  Filled 2021-10-11: qty 12.5

## 2021-10-11 MED ORDER — LACTATED RINGERS IV SOLN: INTRAVENOUS | Status: DC

## 2021-10-11 NOTE — Sepsis Progress Note (Signed)
Elink following Code Sepsis. 

## 2021-10-11 NOTE — ED Notes (Signed)
[phone] pt daughter/POT Jasmine December calling for an update

## 2021-10-11 NOTE — Progress Notes (Signed)
Brent Kirk a 87 y.o. male admitted on 10/11/2021 with fever and tachycardia and concern for sepsis.  Pharmacy has been consulted for vancomycin/cefepime dosing.  10/11/2021: Scr 1.28, LA 2.4,  WBC 14.8  Vital Signs: Tm 102, HR elevated, BP WNL  Of note, difficulty with obtaining weight from bed and patient currently unable to stand for standing weight. Patient reports he last weighed ~215-230 lbs 2 years ago and feels his weight has remained consistent. Will dose based off 200 lbs for now until a weight can be obtained. Chose a conservative estimate as patient appears to have a bit of an AKI.   CrCl cannot be calculated (Unknown ideal weight.).  Plan: START Cefepime 2g IV Q12H GIVE Vancomycin 2,000 mg IV x1 (Wt used: 90 kg- per patient report)  THEN Vancomycin 1,250 mg IV Q24H (Scr used: 1.28, Wt used 90 kg, Ht used: 71 in, Vd used: 0.72, eAUC: 455) Monitor renal function, clinical status, de-escalation, C/S, levels as indicated  F/U weight on floor    Allergies:  No Known Allergies  Filed Weights   10/11/21 1919  Weight: 104.3 kg (230 lb)    Antimicrobials this admission: Vancomycin 10/11/2021>>  Cefepime 10/11/2021>> Flagyl 500 mg IV X1   Microbiology results: 6/7 Bcx: sent 6/7 Ucx: sent   Thank you for allowing pharmacy to be a part of this patient's care.  Jani Gravel, PharmD PGY-1 Acute Care Resident  10/11/2021 7:25 PM

## 2021-10-11 NOTE — ED Provider Triage Note (Signed)
Emergency Medicine Provider Triage Evaluation Note  Brent Kirk , a 87 y.o. male  was evaluated in triage.  Pt complains of feeling unwell.  Patient is usually very sharp and lives independently with his wife, he was acting abnormal today so EMS was called out.  Patient is oriented x2, denies any pain anywhere but febrile and tachycardic with EMS.  Review of Systems  Per HPI  Physical Exam  There were no vitals taken for this visit. Gen:   Awake, ill appearing Resp:  Normal effort  MSK:   Moves extremities without difficulty  Other:  Tachycardic   Medical Decision Making  Medically screening exam initiated at 5:53 PM.  Appropriate orders placed.  [name] was informed that the remainder of the evaluation will be completed by another provider, this initial triage assessment does not replace that evaluation, and the importance of remaining in the ED until their evaluation is complete.  Code sepsis initiated due to fever of 102, tachycardic at 105.  Suspect UTI as source   Theron Arista, [place] 10/11/21 1755

## 2021-10-11 NOTE — Sepsis Progress Note (Signed)
Notified bedside nurse of need to draw repeat lactic acid. 

## 2021-10-11 NOTE — ED Triage Notes (Signed)
Pt BIB [facility] with reports of confusion and lethargy. Pt febrile at 102 and tachy in triage.

## 2021-10-11 NOTE — ED Notes (Signed)
Dr. Johnney Killian informed (via epic secure chat) of patient's LA: 2.4.

## 2021-10-11 NOTE — ED Provider Notes (Signed)
[facility] EMERGENCY DEPARTMENT Provider Note   CSN: [id number] Arrival date & time: 10/11/21  1746     History  Chief Complaint  Patient presents with   Code Sepsis    Brent Kirk is a 87 y.o. male.  HPI Patient reports he started feeling unwell today.  He reports symptoms came on pretty quickly and he started feeling nauseated and got some diarrhea.  Reportedly the patient exhibited some confusion.  He typically is very clear mental status.  EMS was contacted for mental status change.  He was found to be somewhat confused and febrile.  Patient reports only symptoms he is noted is diarrhea and fever.    Home Medications Prior to Admission medications   Medication Sig Start Date End Date Taking? Authorizing Provider  cephALEXin (KEFLEX) 500 MG capsule Take 1 capsule (500 mg total) by mouth 2 (two) times daily. 07/08/14   Patel-Mills, Orvil Feil, PA-C  diphenhydramine-acetaminophen (TYLENOL PM) 25-500 MG TABS Take 1 tablet by mouth at bedtime as needed (sleep).    [provider]  levothyroxine (SYNTHROID, LEVOTHROID) 100 MCG tablet Take 100 mcg by mouth daily.     [provider]  nitrofurantoin, macrocrystal-monohydrate, (MACROBID) 100 MG capsule Take 100 mg by mouth daily as needed (prevent infection).    [provider]  terazosin (HYTRIN) 2 MG capsule Take 2 mg by mouth daily.    [provider]      Allergies    Patient has no known allergies.    Review of Systems   Review of Systems 10 systems reviewed negative except as per HPI Physical Exam Updated Vital Signs BP 129/63 (BP Location: Right Arm)   Pulse (!) 115   Temp (!) 102 F (38.9 C) (Oral)   Resp (!) 21   SpO2 95%  Physical Exam Constitutional:      Comments: Patient is alert.  He is answering questions.  He does appear mildly ill and confused.  No respiratory distress.  HENT:     Head: Normocephalic and atraumatic.     Mouth/Throat:     Pharynx:  Oropharynx is clear.  Eyes:     Extraocular Movements: Extraocular movements intact.  Cardiovascular:     Rate and Rhythm: Normal rate and regular rhythm.  Pulmonary:     Effort: Pulmonary effort is normal.     Breath sounds: Normal breath sounds.  Abdominal:     General: There is no distension.     Palpations: Abdomen is soft.     Tenderness: There is no abdominal tenderness. There is no guarding.  Musculoskeletal:        General: No swelling or tenderness. Normal range of motion.     Right lower leg: No edema.     Left lower leg: No edema.     Comments: No appearance of cellulitis or wounds to extremities.  No joint effusions.  Skin:    General: Skin is warm and dry.     Findings: No rash.  Neurological:     General: No focal deficit present.  Psychiatric:        Mood and Affect: Mood normal.     ED Results / Procedures / Treatments   Labs (all labs ordered are listed, but only abnormal results are displayed) Labs Reviewed  CBC WITH DIFFERENTIAL/PLATELET - Abnormal; Notable for the following components:      Result Value   WBC 14.8 (*)    RBC 4.12 (*)    Hemoglobin 12.2 (*)  HCT 38.9 (*)    Platelets 132 (*)    Neutro Abs 12.8 (*)    Monocytes Absolute 1.1 (*)    All other components within normal limits  CULTURE, BLOOD (ROUTINE X 2)  CULTURE, BLOOD (ROUTINE X 2)  URINE CULTURE  LACTIC ACID, PLASMA  LACTIC ACID, PLASMA  COMPREHENSIVE METABOLIC PANEL  PROTIME-INR  APTT  URINALYSIS, ROUTINE W REFLEX MICROSCOPIC    EKG None EKG is not loading from Muse.  Normal sinus rhythm without any ischemic appearance.  Otherwise normal EKG. Radiology No results found.  Procedures Procedures   CRITICAL CARE Performed by: Charlesetta Shanks   Total critical care time: 30 minutes  Critical care time was exclusive of separately billable procedures and treating other patients.  Critical care was necessary to treat or prevent imminent or life-threatening  deterioration.  Critical care was time spent personally by me on the following activities: development of treatment plan with patient and/or surrogate as well as nursing, discussions with consultants, evaluation of patient's response to treatment, examination of patient, obtaining history from patient or surrogate, ordering and performing treatments and interventions, ordering and review of laboratory studies, ordering and review of radiographic studies, pulse oximetry and re-evaluation of patient's condition.  Medications Ordered in ED Medications  acetaminophen (TYLENOL) tablet 1,000 mg (1,000 mg Oral Given 10/11/21 1847)    ED Course/ Medical Decision Making/ A&P                           Medical Decision Making Risk Prescription drug management. Decision regarding hospitalization.   At baseline patient is a fairly independent and healthy 87 year old.  He arrives febrile and confused.  At this time concern for infectious etiology.  Sepsis protocol initiated.  No significant focus by history.  Review of diagnostic studies shows urinalysis to be grossly positive for UTI with nitrate and leuk esterase positive greater than 50 white blood cells and good specimen.  White count 14.8.  Given cefepime and bank as well as weight-based fluid resuscitation with lactated Ringer's..  Chest x-ray entered by radiology does not show focal pneumonia.  His presentation consistent with early sepsis with mental status change, source of infection, elevated white count and elevated lactic acidosis.  Patient will be admitted for further management.  Consult: [facility] hospitalist Dr. Sid Falcon or for admission        Final Clinical Impression(s) / ED Diagnoses Final diagnoses:  Lower urinary tract infectious disease  Sepsis, due to unspecified organism, unspecified whether acute organ dysfunction present [facility])    Rx / DC Orders ED Discharge Orders     None         Charlesetta Shanks, MD 10/14/21  1519

## 2021-10-12 ENCOUNTER — Encounter (HOSPITAL_COMMUNITY): Payer: Self-pay | Admitting: Internal Medicine

## 2021-10-12 ENCOUNTER — Inpatient Hospital Stay (HOSPITAL_COMMUNITY): Payer: Medicare Other

## 2021-10-12 DIAGNOSIS — E785 Hyperlipidemia, unspecified: Secondary | ICD-10-CM

## 2021-10-12 DIAGNOSIS — R197 Diarrhea, unspecified: Secondary | ICD-10-CM

## 2021-10-12 DIAGNOSIS — D649 Anemia, unspecified: Secondary | ICD-10-CM

## 2021-10-12 DIAGNOSIS — D696 Thrombocytopenia, unspecified: Secondary | ICD-10-CM

## 2021-10-12 DIAGNOSIS — E039 Hypothyroidism, unspecified: Secondary | ICD-10-CM

## 2021-10-12 DIAGNOSIS — G9341 Metabolic encephalopathy: Secondary | ICD-10-CM

## 2021-10-12 DIAGNOSIS — I10 Essential (primary) hypertension: Secondary | ICD-10-CM

## 2021-10-12 DIAGNOSIS — N390 Urinary tract infection, site not specified: Secondary | ICD-10-CM

## 2021-10-12 LAB — LACTIC ACID, PLASMA: Lactic Acid, Venous: 2.1 mmol/L (ref 0.5–1.9)

## 2021-10-12 LAB — CBC
HCT: 34.6 % — ABNORMAL LOW (ref 39.0–52.0)
Hemoglobin: 11.2 g/dL — ABNORMAL LOW (ref 13.0–17.0)
MCH: 30.1 pg (ref 26.0–34.0)
MCHC: 32.4 g/dL (ref 30.0–36.0)
MCV: 93 fL (ref 80.0–100.0)
Platelets: 108 10*3/uL — ABNORMAL LOW (ref 150–400)
RBC: 3.72 MIL/uL — ABNORMAL LOW (ref 4.22–5.81)
RDW: 15.1 % (ref 11.5–15.5)
WBC: 17.2 10*3/uL — ABNORMAL HIGH (ref 4.0–10.5)
nRBC: 0 % (ref 0.0–0.2)

## 2021-10-12 LAB — BASIC METABOLIC PANEL
Anion gap: 8 (ref 5–15)
BUN: 16 mg/dL (ref 8–23)
CO2: 25 mmol/L (ref 22–32)
Calcium: 8.7 mg/dL — ABNORMAL LOW (ref 8.9–10.3)
Chloride: 105 mmol/L (ref 98–111)
Creatinine, Ser: 1.21 mg/dL (ref 0.61–1.24)
GFR, Estimated: 59 mL/min — ABNORMAL LOW (ref >60)
Glucose, Bld: 149 mg/dL — ABNORMAL HIGH (ref 70–99)
Potassium: 4 mmol/L (ref 3.5–5.1)
Sodium: 138 mmol/L (ref 135–145)

## 2021-10-12 LAB — TSH: TSH: 2.887 u[IU]/mL (ref 0.350–4.500)

## 2021-10-12 MED ORDER — ROSUVASTATIN CALCIUM 5 MG PO TABS
5.0000 mg | ORAL_TABLET | Freq: Every day | ORAL | Status: DC
  Administered 2021-10-12 – 2021-10-14 (×3): 5 mg via ORAL
  Filled 2021-10-12 (×3): qty 1

## 2021-10-12 MED ORDER — SODIUM CHLORIDE 0.9 % IV SOLN: INTRAVENOUS | Status: DC

## 2021-10-12 MED ORDER — LEVOTHYROXINE SODIUM 75 MCG PO TABS
75.0000 ug | ORAL_TABLET | Freq: Every day | ORAL | Status: DC
  Administered 2021-10-12 – 2021-10-14 (×3): 75 ug via ORAL
  Filled 2021-10-12 (×3): qty 1

## 2021-10-12 MED ORDER — ACETAMINOPHEN 325 MG PO TABS
650.0000 mg | ORAL_TABLET | Freq: Four times a day (QID) | ORAL | Status: DC | PRN
  Administered 2021-10-13: 650 mg via ORAL
  Filled 2021-10-12: qty 2

## 2021-10-12 MED ORDER — ACETAMINOPHEN 650 MG RE SUPP: 650.0000 mg | Freq: Four times a day (QID) | RECTAL | Status: DC | PRN

## 2021-10-12 MED ORDER — SODIUM CHLORIDE 0.9 % IV SOLN
1.0000 g | INTRAVENOUS | Status: DC
  Administered 2021-10-12 – 2021-10-13 (×2): 1 g via INTRAVENOUS
  Filled 2021-10-12 (×2): qty 10

## 2021-10-12 NOTE — Progress Notes (Signed)
PROGRESS NOTE  Brent Kirk  DOB: 12-Oct-1934  PCP: Marden Noble, MD VVO:[id number]  DOA: 10/11/2021  LOS: 1 day  Hospital Day: 2  Brief narrative: Brent Kirk is a 87 y.o. 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 6/7, patient was brought to the ED from home for altered mental status, fever, lethargy.  He also endorsed urinary frequency, urgency  In the ED, patient had a temperature of 102, heart rate 115, blood pressure stable, tachypneic to 20s, breathing on room air Initial labs with WC count elevated to 14.8, lactic acid elevated to 2.4, creatinine elevated to 1.28 Urinalysis with hazy yellow urine with a small amount of hemoglobin, moderate leukocytes, positive nitrite and many bacteria Chest x-ray did not suggest pneumonia. Patient was started on management per sepsis pathway Admitted to hospitalist service  Subjective: Patient was seen and examined this morning.  Pleasant elderly Caucasian male.  Not in distress.  No family at bedside Chart reviewed Tmax 102 on admission, hemodynamically stable Labs from this morning with WBC count further up at 17.2, lactic acid level trending down  Principal Problem:   UTI (urinary tract infection) Active Problems:   Sepsis (HCC)   Acute metabolic encephalopathy   Diarrhea   Anemia   Thrombocytopenia (HCC)   HTN (hypertension)   HLD (hyperlipidemia)   Hypothyroidism    Assessment and plan: Severe sepsis secondary to UTI -Presented with fever, lethargy, altered mental status, tachycardia, leukocytosis, lactic acidosis and positive urinalysis  -Blood culture, urine culture sent -On IV Rocephin. -Lactate is gradually improving on IV fluid.  Continue maintenance IV fluid. -Trend temperature, WBC count Recent Labs  Lab 10/11/21 1807 10/11/21 2357 10/12/21 0500  WBC 14.8*  --  17.2*  LATICACIDVEN 2.4* 2.1*  --    Acute metabolic encephalopathy -Likely  2/2 UTI. No focal neuro deficit on exam but does have stroke risk factors.  No meningeal signs. -CT head unremarkable for acute changes   Diarrhea -No complaints of nausea, vomiting, or abdominal pain. Abdominal exam benign.  -Ordered for C diff PCR and GI pathogen panel -Enteric precautions    HTN -PTA, patient was on valsartan 160 mg daily, HCTZ 12.5 mg daily  -Currently on hold because of sepsis -Continue to monitor blood pressure.  IV hydralazine as needed.   HLD -Continue Crestor   Hypothyroidism -Per pharmacy med rec, noncompliant with Synthroid. -TSH is normal however.   Goals of care   Code Status: Full Code    Mobility: Encourage ambulation.  PT eval to be obtained  Skin assessment:     Nutritional status:  Body mass index is 29.03 kg/m.          Diet:  Diet Order             Diet regular Room service appropriate? Yes; Fluid consistency: Thin  Diet effective now                   DVT prophylaxis:  SCDs Start: 10/12/21 0310   Antimicrobials: IV Rocephin Fluid: NS at 75 mill per hour Consultants: None Family Communication: None at bedside  Status is: Inpatient  Continue in-hospital care because: Needs IV antibiotics, IV fluid Level of care: Telemetry Medical   Dispo: The patient is from: Home              Anticipated d/c is to: Hopefully home in 1 to 2 days  Patient currently is not medically stable to d/c.   Difficult to place patient No     Infusions:   sodium chloride     cefTRIAXone (ROCEPHIN)  IV      Scheduled Meds:  levothyroxine  75 mcg Oral Q0600   rosuvastatin  5 mg Oral Daily    PRN meds: acetaminophen **OR** acetaminophen   Antimicrobials: Anti-infectives (From admission, onward)    Start     Dose/Rate Route Frequency Ordered Stop   10/12/21 2200  cefTRIAXone (ROCEPHIN) 1 g in sodium chloride 0.9 % 100 mL IVPB        1 g 200 mL/hr over 30 Minutes Intravenous Every 24 hours 10/12/21 0312      10/12/21 1930  vancomycin (VANCOREADY) IVPB 1250 mg/250 mL  Status:  Discontinued        1,250 mg 166.7 mL/hr over 90 Minutes Intravenous Every 24 hours 10/11/21 1943 10/12/21 0312   10/11/21 2200  ceFEPIme (MAXIPIME) 2 g in sodium chloride 0.9 % 100 mL IVPB  Status:  Discontinued        2 g 200 mL/hr over 30 Minutes Intravenous Every 12 hours 10/11/21 1923 10/12/21 0312   10/11/21 1930  vancomycin (VANCOREADY) IVPB 2000 mg/400 mL        2,000 mg 200 mL/hr over 120 Minutes Intravenous  Once 10/11/21 1923 10/11/21 2249   10/11/21 1915  ceFEPIme (MAXIPIME) 2 g in sodium chloride 0.9 % 100 mL IVPB  Status:  Discontinued        2 g 200 mL/hr over 30 Minutes Intravenous  Once 10/11/21 1907 10/11/21 1923   10/11/21 1915  metroNIDAZOLE (FLAGYL) IVPB 500 mg        500 mg 100 mL/hr over 60 Minutes Intravenous  Once 10/11/21 1907 10/11/21 2111   10/11/21 1915  vancomycin (VANCOCIN) IVPB 1000 mg/200 mL premix  Status:  Discontinued        1,000 mg 200 mL/hr over 60 Minutes Intravenous  Once 10/11/21 1907 10/11/21 1923       Objective: Vitals:   10/12/21 1100 10/12/21 1237  BP: (!) 152/76 (!) 164/81  Pulse: 85 82  Resp: (!) 23 15  Temp:    SpO2: 99% 98%    Intake/Output Summary (Last 24 hours) at 10/12/2021 1405 Last data filed at 10/11/2021 2335 Gross per 24 hour  Intake 3100 ml  Output --  Net 3100 ml   Filed Weights   10/11/21 1919 10/12/21 0450  Weight: 104.3 kg 99.8 kg   Weight change:  Body mass index is 29.03 kg/m.   Physical Exam: General exam: Pleasant, elderly Caucasian male.  Not in distress Skin: No rashes, lesions or ulcers. HEENT: Atraumatic, normocephalic, no obvious bleeding Lungs: Clear to auscultation bilaterally CVS: Regular rate and rhythm, no murmur GI/Abd soft, nontender, nondistended, bowel sound present CNS: Alert, awake, oriented to place and person, slow to respond Psychiatry: Mood appropriate Extremities: No pedal edema, no calf tenderness  Data  Review: I have personally reviewed the laboratory data and studies available.  F/u labs ordered Unresulted Labs (From admission, onward)     Start     Ordered   10/13/21 0500  CBC with Differential/Platelet  Daily,   R      10/12/21 0751   10/13/21 0500  Basic metabolic panel  Daily,   R      10/12/21 0751   10/13/21 0500  Lactic acid, plasma  Tomorrow morning,   R  10/12/21 0751   10/12/21 0311  C Difficile Quick Screen w PCR reflex  (C Difficile quick screen w PCR reflex panel )  Once, for 24 hours,   TIMED       References:    CDiff Information Tool   10/12/21 0312   10/12/21 0311  Gastrointestinal Panel by PCR , Stool  (Gastrointestinal Panel by PCR, Stool                                                                                                                                                     **Does Not include CLOSTRIDIUM DIFFICILE testing. **If CDIFF testing is needed, place order from the "C Difficile Testing" order set.**)  Once,   R        10/12/21 0312   10/11/21 2333  Lactic acid, plasma  ONCE - STAT,   STAT        10/11/21 2332   10/11/21 1754  Urine Culture  (Undifferentiated presentation (screening labs and basic nursing orders))  ONCE - URGENT,   URGENT       Question:  Indication  Answer:  Sepsis   10/11/21 1753            Signed, Lorin Glass, MD [facility] 10/12/2021

## 2021-10-12 NOTE — H&P (Signed)
History and Physical    Brent Kirk [id number] DOB: 03/29/1935 DOA: 10/11/2021  PCP: Marden NobleGates, Robert, MD  Patient coming from: Home  Chief Complaint: Confusion  HPI: Brent GeorgeJames E Boling is a 87 y.o. male with medical history significant of hypertension, hyperlipidemia, hypothyroidism presented to the ED for evaluation of confusion and lethargy.  Febrile and tachycardic on arrival.  Labs showing WBC 14.8, hemoglobin 12.2 (no significant change from baseline), platelet count 132k (stable).  Sodium 139, potassium 4.4, chloride 100, bicarb 26, BUN 18, creatinine 1.2, glucose 167.  LFTs normal.  Blood cultures drawn.  Lactic acid 2.4.  INR 1.1.  UA with positive nitrite, moderate leukocytes, and microscopy showing greater than 50 WBCs and many bacteria.  Urine culture pending.  COVID and influenza PCR negative.  Chest x-ray not suggestive of pneumonia. Patient was given, cefepime, Flagyl, vancomycin, and 3.5 LR boluses.  Patient states he lives with his wife and took her to a doctor's appointment yesterday, He is not sure why he was brought into the emergency room. Reports an episode of diarrhea yesterday and also endorsing urinary frequency and urgency. Denies fevers, chills, nausea, vomiting, or abdominal pain. Denies cough, shortness of breath, or chest pain.   Review of Systems:  Review of Systems  All other systems reviewed and are negative.   Past Medical History:  Diagnosis Date   Cervical spondylosis without myelopathy    Late effects of acute poliomyelitis    Lumbago    Thoracic spondylosis without myelopathy     History reviewed. No pertinent surgical history.   reports that he has never smoked. He does not have any smokeless tobacco history on file. He reports that he does not drink alcohol and does not use drugs.  No Known Allergies  History reviewed. No pertinent family history.  Prior to Admission medications   Medication Sig Start Date End Date Taking? Authorizing  Provider  acetaminophen (TYLENOL) 500 MG tablet Take 500-1,000 mg by mouth every 6 (six) hours as needed for mild pain or headache.   Yes [provider]  levothyroxine (SYNTHROID) 75 MCG tablet Take 75 mcg by mouth daily before breakfast.   Yes [provider]  rosuvastatin (CRESTOR) 5 MG tablet Take 5 mg by mouth daily.   Yes [provider]  valsartan-hydrochlorothiazide (DIOVAN-HCT) 160-12.5 MG tablet Take 1 tablet by mouth daily.   Yes [provider]  cephALEXin (KEFLEX) 500 MG capsule Take 1 capsule (500 mg total) by mouth 2 (two) times daily. Patient not taking: Reported on 10/11/2021 07/08/14   Catha Gosselinatel-Mills, Hanna, PA-C    Physical Exam: Vitals:   10/11/21 2215 10/11/21 2245 10/11/21 2315 10/11/21 2345  BP: (!) 144/78 (!) 151/75 (!) 167/73 (!) 167/80  Pulse: 79 81 82 79  Resp: (!) 21 20 (!) 23 20  Temp:      TempSrc:      SpO2: 97% 94% 96% 96%  Weight:        Physical Exam Vitals reviewed.  Constitutional:      General: He is not in acute distress. HENT:     Head: Normocephalic and atraumatic.     Mouth/Throat:     Mouth: Mucous membranes are dry.  Eyes:     Extraocular Movements: Extraocular movements intact.  Cardiovascular:     Rate and Rhythm: Normal rate and regular rhythm.     Pulses: Normal pulses.  Pulmonary:     Effort: Pulmonary effort is normal. No respiratory distress.     Breath  sounds: Normal breath sounds. No wheezing or rales.  Abdominal:     General: Bowel sounds are normal. There is no distension.     Palpations: Abdomen is soft.     Tenderness: There is no abdominal tenderness.  Musculoskeletal:        General: No swelling or tenderness.     Cervical back: Normal range of motion. No rigidity.  Skin:    General: Skin is warm and dry.  Neurological:     General: No focal deficit present.     Mental Status: He is alert.     Cranial Nerves: No cranial nerve deficit.     Sensory: No sensory deficit.     Motor: No  weakness.     Comments: Oriented to person and place only      Labs on Admission: I have personally reviewed following labs and imaging studies  CBC: Recent Labs  Lab 10/11/21 1807  WBC 14.8*  NEUTROABS 12.8*  HGB 12.2*  HCT 38.9*  MCV 94.4  PLT 132*   Basic Metabolic Panel: Recent Labs  Lab 10/11/21 1807  NA 139  K 4.4  CL 100  CO2 26  GLUCOSE 167*  BUN 18  CREATININE 1.28*  CALCIUM 9.5   GFR: CrCl cannot be calculated (Unknown ideal weight.). Liver Function Tests: Recent Labs  Lab 10/11/21 1807  AST 17  ALT 16  ALKPHOS 52  BILITOT 0.6  PROT 7.7  ALBUMIN 4.0   No results for input(s): LIPASE, AMYLASE in the last 168 hours. No results for input(s): AMMONIA in the last 168 hours. Coagulation Profile: Recent Labs  Lab 10/11/21 1807  INR 1.1   Cardiac Enzymes: No results for input(s): CKTOTAL, CKMB, CKMBINDEX, TROPONINI in the last 168 hours. BNP (last 3 results) No results for input(s): PROBNP in the last 8760 hours. HbA1C: No results for input(s): HGBA1C in the last 72 hours. CBG: No results for input(s): GLUCAP in the last 168 hours. Lipid Profile: No results for input(s): CHOL, HDL, LDLCALC, TRIG, CHOLHDL, LDLDIRECT in the last 72 hours. Thyroid Function Tests: No results for input(s): TSH, T4TOTAL, FREET4, T3FREE, THYROIDAB in the last 72 hours. Anemia Panel: No results for input(s): VITAMINB12, FOLATE, FERRITIN, TIBC, IRON, RETICCTPCT in the last 72 hours. Urine analysis:    Component Value Date/Time   COLORURINE YELLOW 10/11/2021 2036   APPEARANCEUR HAZY (A) 10/11/2021 2036   LABSPEC 1.015 10/11/2021 2036   PHURINE 5.0 10/11/2021 2036   GLUCOSEU NEGATIVE 10/11/2021 2036   HGBUR SMALL (A) 10/11/2021 2036   BILIRUBINUR NEGATIVE 10/11/2021 2036   KETONESUR NEGATIVE 10/11/2021 2036   PROTEINUR NEGATIVE 10/11/2021 2036   NITRITE POSITIVE (A) 10/11/2021 2036   LEUKOCYTESUR MODERATE (A) 10/11/2021 2036    Radiological Exams on Admission:  I have personally reviewed images DG Chest Portable 1 View  Result Date: 10/11/2021 CLINICAL DATA:  Shortness of breath and sepsis. EXAM: PORTABLE CHEST 1 VIEW COMPARISON:  07/08/2014 FINDINGS: The heart size and mediastinal contours are within normal limits. There is no evidence of pulmonary edema, consolidation, pneumothorax or pleural fluid. The visualized skeletal structures are unremarkable. IMPRESSION: No active disease. Electronically Signed   By: Irish Lack M.D.   On: 10/11/2021 18:48    EKG: Pending at this time.  Assessment and Plan  Severe sepsis secondary to UTI Meets criteria for sepsis with febrile, tachycardia, leukocytosis, and lactic acidosis. UA with positive nitrite, moderate leukocytes, and microscopy showing greater than 50 WBCs and many bacteria. -Ceftriaxone -Received 3.5 L  IVF boluses in the ED, tachycardia has resolved. Not hypotensive.  Lactic acidosis improving. -Urine and blood cultures pending -Trend WBC count  Acute metabolic encephalopathy Likely 2/2 UTI. No focal neuro deficit on exam but does have stroke risk factors.  No meningeal signs. -Stat CT head  Diarrhea No complaints of nausea, vomiting, or abdominal pain. Abdominal exam benign. COVID and influenza PCR negative. -C diff PCR and GI pathogen panel -Enteric precautions  Mild anemia and thrombocytopenia Stable, no signs of active bleeding.  -Continue to monitor  HTN -Hold antihypertensives at this time in the setting of severe sepsis.  HLD -Continue Crestor  Hypothyroidism Per pharmacy med rec, noncompliant with Synthroid. -Continue current dose of Synthroid -Check TSH  DVT prophylaxis: SCDs Code Status: Full Code.  Unable to discuss CODE STATUS with the patient at this time given his confusion.  Please readdress in the morning. Family Communication: No family available at this time. Level of care: Telemetry bed Admission status: It is my clinical opinion that admission to  INPATIENT is reasonable and necessary because of the expectation that this patient will require hospital care that crosses at least 2 midnights to treat this condition based on the medical complexity of the problems presented.  Given the aforementioned information, the predictability of an adverse outcome is felt to be significant.   John Giovanni MD [facility]  If 7PM-7AM, please contact night-coverage [email or web address]  10/12/2021, 12:39 AM

## 2021-10-12 NOTE — Progress Notes (Signed)
New Admission Note:   Arrival Method:  Mental Orientation: Alert and oriented  Telemetry: box 10  Assessment: Completed Skin: scratches on right hi and arms, redness to buttocks blanchable  UO:HFGB forearm  Pain:0/10  Tubes: Safety Measures: Safety Fall Prevention Plan has been given, discussed and signed Admission: Completed [facility] Orientation: Patient has been orientated to the room, unit and staff.  Family:none   Orders have been reviewed and implemented. Will continue to monitor the patient. Call light has been placed within reach and bed alarm has been activated.   Maria Coin RN [facility] Phone: [phone]

## 2021-10-12 NOTE — ED Notes (Signed)
Update given to pts daughter, Jasmine December.  Pt reported itg was ok to give her an update

## 2021-10-12 NOTE — Plan of Care (Signed)
  Problem: Education: Goal: Knowledge of General Education information will improve Description Including pain rating scale, medication(s)/side effects and non-pharmacologic comfort measures Outcome: Progressing   

## 2021-10-13 DIAGNOSIS — N390 Urinary tract infection, site not specified: Secondary | ICD-10-CM

## 2021-10-13 LAB — CBC WITH DIFFERENTIAL/PLATELET
Abs Immature Granulocytes: 0.03 10*3/uL (ref 0.00–0.07)
Basophils Absolute: 0 10*3/uL (ref 0.0–0.1)
Basophils Relative: 0 %
Eosinophils Absolute: 0.1 10*3/uL (ref 0.0–0.5)
Eosinophils Relative: 1 %
HCT: 31.2 % — ABNORMAL LOW (ref 39.0–52.0)
Hemoglobin: 10 g/dL — ABNORMAL LOW (ref 13.0–17.0)
Immature Granulocytes: 0 %
Lymphocytes Relative: 13 %
Lymphs Abs: 1.4 10*3/uL (ref 0.7–4.0)
MCH: 29.3 pg (ref 26.0–34.0)
MCHC: 32.1 g/dL (ref 30.0–36.0)
MCV: 91.5 fL (ref 80.0–100.0)
Monocytes Absolute: 0.8 10*3/uL (ref 0.1–1.0)
Monocytes Relative: 7 %
Neutro Abs: 8.2 10*3/uL — ABNORMAL HIGH (ref 1.7–7.7)
Neutrophils Relative %: 79 %
Platelets: 108 10*3/uL — ABNORMAL LOW (ref 150–400)
RBC: 3.41 MIL/uL — ABNORMAL LOW (ref 4.22–5.81)
RDW: 14.9 % (ref 11.5–15.5)
WBC: 10.5 10*3/uL (ref 4.0–10.5)
nRBC: 0 % (ref 0.0–0.2)

## 2021-10-13 LAB — BASIC METABOLIC PANEL
Anion gap: 6 (ref 5–15)
BUN: 13 mg/dL (ref 8–23)
CO2: 26 mmol/L (ref 22–32)
Calcium: 8.2 mg/dL — ABNORMAL LOW (ref 8.9–10.3)
Chloride: 104 mmol/L (ref 98–111)
Creatinine, Ser: 1.15 mg/dL (ref 0.61–1.24)
GFR, Estimated: >60 mL/min (ref >60)
Glucose, Bld: 127 mg/dL — ABNORMAL HIGH (ref 70–99)
Potassium: 3.7 mmol/L (ref 3.5–5.1)
Sodium: 136 mmol/L (ref 135–145)

## 2021-10-13 LAB — LACTIC ACID, PLASMA: Lactic Acid, Venous: 0.9 mmol/L (ref 0.5–1.9)

## 2021-10-13 MED ORDER — MELATONIN 3 MG PO TABS
3.0000 mg | ORAL_TABLET | Freq: Every evening | ORAL | Status: DC | PRN
  Administered 2021-10-13: 3 mg via ORAL
  Filled 2021-10-13: qty 1

## 2021-10-13 MED ORDER — SENNOSIDES-DOCUSATE SODIUM 8.6-50 MG PO TABS
1.0000 | ORAL_TABLET | Freq: Every day | ORAL | Status: DC
  Filled 2021-10-13: qty 1

## 2021-10-13 MED ORDER — IRBESARTAN 75 MG PO TABS
150.0000 mg | ORAL_TABLET | Freq: Every day | ORAL | Status: DC
  Administered 2021-10-13 – 2021-10-14 (×2): 150 mg via ORAL
  Filled 2021-10-13 (×3): qty 2

## 2021-10-13 MED ORDER — POLYETHYLENE GLYCOL 3350 17 G PO PACK: 17.0000 g | PACK | Freq: Every day | ORAL | Status: DC | PRN

## 2021-10-13 MED ORDER — BACITRACIN-NEOMYCIN-POLYMYXIN OINTMENT TUBE
TOPICAL_OINTMENT | Freq: Three times a day (TID) | CUTANEOUS | Status: DC
  Filled 2021-10-13: qty 14

## 2021-10-13 NOTE — Evaluation (Signed)
Physical Therapy Evaluation Patient Details Name: Brent Kirk MRN: [id number] DOB: 06/28/1934 Today's Date: 10/13/2021  History of Present Illness  87 y/o male presented to ED on 10/11/21 for confusion and lethargy. Admitted for sepsis 2/2 UTI. PMH: HTN, hypothyroidism  Clinical Impression  Patient admitted with the above. PTA, patient reports living with wife and independence with rollator. Patient presents with weakness, impaired balance, decreased activity tolerance, and impaired cognition. Patient with poor awareness of safety and deficits with poor attention. Patient impulsive with all mobility requiring min guard - minA with use of RW. Poor utilization of RW requiring assist and cues for close proximity. No family present to determine baseline functioning or cognition. Patient will benefit from skilled PT services during acute stay to address listed deficits. Recommend HHPT at discharge to maximize functional independence and safety. Patient states he is close to baseline, however he is increased risk for falls based on performance on evaluation with use of RW.   *Incontinent of urine once in hallway with little awareness of situation and wanting to continue walking down the hallway with urine on socks.        Recommendations for follow up therapy are one component of a multi-disciplinary discharge planning process, led by the attending physician.  Recommendations may be updated based on patient status, additional functional criteria and insurance authorization.  Follow Up Recommendations Home health PT    Assistance Recommended at Discharge Frequent or constant Supervision/Assistance  Patient can return home with the following  A little help with walking and/or transfers;A little help with bathing/dressing/bathroom;Assistance with cooking/housework;Direct supervision/assist for medications management;Direct supervision/assist for financial management;Assist for transportation;Help with  stairs or ramp for entrance    Equipment Recommendations Rolling Brain Honeycutt (2 wheels)  Recommendations for Other Services  OT consult    Functional Status Assessment Patient has had a recent decline in their functional status and demonstrates the ability to make significant improvements in function in a reasonable and predictable amount of time.     Precautions / Restrictions Precautions Precautions: Fall Restrictions Weight Bearing Restrictions: No      Mobility  Bed Mobility Overal bed mobility: Needs Assistance Bed Mobility: Supine to Sit, Sit to Supine     Supine to sit: Supervision Sit to supine: Supervision   General bed mobility comments: supervision for safety. Impulsive quick movements    Transfers Overall transfer level: Needs assistance Equipment used: Rolling [place] Nellums (2 wheels) Transfers: Sit to/from Stand Sit to Stand: Min guard           General transfer comment: min guard for safety. Cues for hand placement but poor follow through    Ambulation/Gait Ambulation/Gait assistance: Min assist Gait Distance (Feet): 20 Feet Assistive device: Rolling Hakeen Shipes (2 wheels) Gait Pattern/deviations: Step-through pattern, Decreased stride length, Decreased stance time - right, Decreased weight shift to right, Trunk flexed Gait velocity: decreased     General Gait Details: assist for balance and RW management. Cues for close RW proximity as patient tends to push RW too far out in front of him. Incontinent of urine once in hallway and poor awareness of situation.  Stairs            Wheelchair Mobility    Modified Rankin (Stroke Patients Only)       Balance Overall balance assessment: Needs assistance Sitting-balance support: No upper extremity supported, Feet supported Sitting balance-Leahy Scale: Good     Standing balance support: Bilateral upper extremity supported, Reliant on assistive device for balance Standing balance-Leahy Scale: Poor  Standing  balance comment: reliant on UE support                             Pertinent Vitals/Pain Pain Assessment Pain Assessment: No/denies pain    Home Living Family/patient expects to be discharged to:: Private residence Living Arrangements: Spouse/significant other Available Help at Discharge: Family Type of Home: House Home Access: Level entry     Alternate Level Stairs-Number of Steps: flight but has a chair lift Home Layout: Two level Home Equipment: Pharmacist, hospital (2 wheels);Rollator (4 wheels);Wheelchair - manual Additional Comments: states he enters his home through the basement and uses chair lift to go up the stairs. He reports he has been caring for his wife but unsure of accuracy    Prior Function Prior Level of Function : Independent/Modified Independent             Mobility Comments: uses rollator for mobility       Hand Dominance        Extremity/Trunk Assessment   Upper Extremity Assessment Upper Extremity Assessment: Generalized weakness    Lower Extremity Assessment Lower Extremity Assessment: Generalized weakness (Patient states he needs to get R LE operated on but hasn't had the chance)    Cervical / Trunk Assessment Cervical / Trunk Assessment: Kyphotic  Communication   Communication: No difficulties  Cognition Arousal/Alertness: Awake/alert Behavior During Therapy: Impulsive Overall Cognitive Status: Impaired/Different from baseline Area of Impairment: Attention, Memory, Following commands, Safety/judgement, Awareness, Problem solving                   Current Attention Level: Sustained Memory: Decreased short-term memory Following Commands: Follows one step commands inconsistently Safety/Judgement: Decreased awareness of safety, Decreased awareness of deficits Awareness: Intellectual Problem Solving: Slow processing General Comments: very impulsive and intermittently following commands. General confusion noted  but no family present to determine baseline        General Comments      Exercises     Assessment/Plan    PT Assessment Patient needs continued PT services  PT Problem List Decreased strength;Decreased activity tolerance;Decreased balance;Decreased mobility;Decreased cognition;Decreased knowledge of use of DME;Decreased safety awareness;Decreased knowledge of precautions       PT Treatment Interventions DME instruction;Gait training;Therapeutic activities;Functional mobility training;Therapeutic exercise;Balance training;Patient/family education    PT Goals (Current goals can be found in the Care Plan section)  Acute Rehab PT Goals Patient Stated Goal: did not state PT Goal Formulation: With patient Time For Goal Achievement: 10/27/21 Potential to Achieve Goals: Fair    Frequency Min 3X/week     Co-evaluation               AM-PAC PT "6 Clicks" Mobility  Outcome Measure Help needed turning from your back to your side while in a flat bed without using bedrails?: A Little Help needed moving from lying on your back to sitting on the side of a flat bed without using bedrails?: A Little Help needed moving to and from a bed to a chair (including a wheelchair)?: A Little Help needed standing up from a chair using your arms (e.g., wheelchair or bedside chair)?: A Little Help needed to walk in hospital room?: A Little Help needed climbing 3-5 steps with a railing? : A Lot 6 Click Score: 17    End of Session Equipment Utilized During Treatment: Gait belt Activity Tolerance: Patient tolerated treatment well Patient left: in bed;with call bell/phone within reach;with bed alarm set  Nurse Communication: Mobility status PT Visit Diagnosis: Unsteadiness on feet (R26.81);Muscle weakness (generalized) (M62.81);Other abnormalities of gait and mobility (R26.89)    Time: [id number] PT Time Calculation (min) (ACUTE ONLY): 24 min   Charges:   PT Evaluation $PT Eval Moderate  Complexity: 1 Mod PT Treatments $Therapeutic Activity: 8-22 mins        Barry Culverhouse A. Gilford Rile PT, DPT Acute Rehabilitation Services Office [phone]   Linna Hoff 10/13/2021, 5:02 PM

## 2021-10-13 NOTE — TOC Initial Note (Signed)
Transition of Care [facility]) - Initial/Assessment Note    Patient Details  Name: Brent Kirk MRN: [id number] Date of Birth: 13-Apr-1935  Transition of Care [facility]) CM/SW Contact:    Tom-Johnson, Hershal Coria, RN Phone Number: 10/13/2021, 1:00 PM  Clinical Narrative:                  CM spoke with patient at bedside about needs for post hospital transition. Admitted for AMS and found to have UTI. Currently on IV abx.  From home with wife. Has two daughters, one lives 20 mins away. Independent with care and drive self prior to admission.  Has a cane, walker, w/c and shower chair at home.  PCP is Marden Noble, MD and [facility] [address] [place]. Awaits PT/OT eval for recommendations and needs. CM will continue to follow with needs.      Barriers to Discharge: Continued Medical Work up   Patient Goals and CMS Choice Patient states their goals for this hospitalization and ongoing recovery are:: To return home CMS Medicare.gov Compare Post Acute Care list provided to:: Patient    Expected Discharge Plan and Services     Discharge Planning Services: CM Consult   Living arrangements for the past 2 months: Single Family Home                                      Prior Living Arrangements/Services Living arrangements for the past 2 months: Single Family Home Lives with:: Spouse Patient language and need for interpreter reviewed:: Yes Do you feel safe going back to the place where you live?: Yes      Need for Family Participation in Patient Care: Yes (Comment) Care giver support system in place?: Yes (comment) Current home services: DME (Cane, walker, w/c, shower chair) Criminal Activity/Legal Involvement Pertinent to Current Situation/Hospitalization: No - Comment as needed  Activities of Daily Living Home Assistive Devices/Equipment: Other (Comment), Shower chair with back, Environmental consultant (specify type), Wheelchair, [employer] (specify quad or straight), Bedside commode/3-in-1  (chair lift) ADL Screening (condition at time of admission) Patient's cognitive ability adequate to safely complete daily activities?: No Is the patient deaf or have difficulty hearing?: Yes (right ear) Does the patient have difficulty seeing, even when wearing glasses/contacts?: No Does the patient have difficulty concentrating, remembering, or making decisions?: No Patient able to express need for assistance with ADLs?: No Does the patient have difficulty dressing or bathing?: Yes Independently performs ADLs?: Yes (appropriate for developmental age) Does the patient have difficulty walking or climbing stairs?: Yes Weakness of Legs: Both Weakness of Arms/Hands: Both  Permission Sought/Granted Permission sought to share information with : Case Manager, Family Supports Permission granted to share information with : Yes, Verbal Permission Granted              Emotional Assessment Appearance:: Appears stated age Attitude/Demeanor/Rapport: Engaged, Gracious Affect (typically observed): Accepting, Appropriate, Calm, Hopeful Orientation: : Oriented to Self, Oriented to Place, Oriented to  Time, Oriented to Situation Alcohol / Substance Use: Not Applicable Psych Involvement: No (comment)  Admission diagnosis:  Lower urinary tract infectious disease [N39.0] Sepsis (HCC) [A41.9] Sepsis, due to unspecified organism, unspecified whether acute organ dysfunction present [facility]) [A41.9] Patient Active Problem List   Diagnosis Date Noted   UTI (urinary tract infection) 10/12/2021   Acute metabolic encephalopathy 10/12/2021   Diarrhea 10/12/2021   Anemia 10/12/2021   Thrombocytopenia (HCC) 10/12/2021  HTN (hypertension) 10/12/2021   HLD (hyperlipidemia) 10/12/2021   Hypothyroidism 10/12/2021   Sepsis (HCC) 10/11/2021   PCP:  Marden Noble, MD Pharmacy:   [facility] [phone], [place] - [phone] AT [facility] [address] [place]  [address] Phone: [phone] Fax: [phone]     Social Determinants of Health (SDOH) Interventions    Readmission Risk Interventions     No data to display

## 2021-10-13 NOTE — Progress Notes (Signed)
PROGRESS NOTE  Brent Kirk  DOB: 27-Jul-1934  PCP: Josetta Huddle, MD [id number]  DOA: 10/11/2021  LOS: 2 days  Hospital Day: 3  Brief narrative: Brent Kirk is a 87 y.o. 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 6/7, patient was brought to the ED from home for altered mental status, fever, lethargy.  He also endorsed urinary frequency, urgency  In the ED, patient had a temperature of 102, heart rate 115, blood pressure stable, tachypneic to 20s, breathing on room air Initial labs with WC count elevated to 14.8, lactic acid elevated to 2.4, creatinine elevated to 1.28 Urinalysis with hazy yellow urine with a small amount of hemoglobin, moderate leukocytes, positive nitrite and many bacteria Chest x-ray did not suggest pneumonia. Patient was started on management per sepsis pathway Admitted to hospitalist service  Subjective: Patient was seen and examined this morning.  Lying down in bed.  Not in distress.  Feels better.  Diarrhea is stopped.  Strength improving.  Urine culture is growing more than 100,000 CFU per mL of E. coli.    Principal Problem:   UTI (urinary tract infection) Active Problems:   Sepsis ([place])   Acute metabolic encephalopathy   Diarrhea   Anemia   Thrombocytopenia (HCC)   HTN (hypertension)   HLD (hyperlipidemia)   Hypothyroidism    Assessment and plan: Severe sepsis secondary to UTI -Presented with fever, lethargy, altered mental status, tachycardia, leukocytosis, lactic acidosis and positive urinalysis  -Blood culture, urine culture sent -Currently maintenance and labs are improving with IV Rocephin. Recent Labs  Lab 10/11/21 1807 10/11/21 2357 10/12/21 0500 10/13/21 0339  WBC 14.8*  --  17.2* 10.5  LATICACIDVEN 2.4* 2.1*  --  0.9   Acute metabolic encephalopathy -Likely 2/2 UTI. No focal neuro deficit on exam but does have stroke risk factors.  No meningeal  signs. -CT head unremarkable for acute changes   Constipation -Initially patient had diarrhea.  Patient reports that he is not had a BM in 24 hours and 1 today.  Senokot ordered.    HTN -PTA, patient was on valsartan 160 mg daily, HCTZ 12.5 mg daily  -Currently on hold because of sepsis -Continue to monitor blood pressure.  IV hydralazine as needed.   HLD -Continue Crestor   Hypothyroidism -Per pharmacy med rec, noncompliant with Synthroid. -TSH is normal however.   Goals of care   Code Status: Full Code    Mobility: Encourage ambulation.  PT eval to be obtained  Skin assessment:     Nutritional status:  Body mass index is 29.78 kg/m.          Diet:  Diet Order             Diet regular Room service appropriate? Yes; Fluid consistency: Thin  Diet effective now                   DVT prophylaxis:  SCDs Start: 10/12/21 0310   Antimicrobials: IV Rocephin Fluid: Can stop IV fluid today. Consultants: None Family Communication: None at bedside  Status is: Inpatient  Continue in-hospital care because: Pending urine culture sensitivity, pending PT eval Level of care: Telemetry Medical   Dispo: The patient is from: Home              Anticipated d/c is to: Hopefully home in 1 to 2 days              Patient currently is not  medically stable to d/c.   Difficult to place patient No     Infusions:   sodium chloride 50 mL/hr at 10/12/21 1551   cefTRIAXone (ROCEPHIN)  IV 1 g (10/12/21 2123)    Scheduled Meds:  irbesartan  150 mg Oral Daily   levothyroxine  75 mcg Oral Q0600   neomycin-bacitracin-polymyxin   Topical TID   rosuvastatin  5 mg Oral Daily   senna-docusate  1 tablet Oral QHS    PRN meds: acetaminophen **OR** acetaminophen, polyethylene glycol   Antimicrobials: Anti-infectives (From admission, onward)    Start     Dose/Rate Route Frequency Ordered Stop   10/12/21 2200  cefTRIAXone (ROCEPHIN) 1 g in sodium chloride 0.9 % 100 mL IVPB         1 g 200 mL/hr over 30 Minutes Intravenous Every 24 hours 10/12/21 0312     10/12/21 1930  vancomycin (VANCOREADY) IVPB 1250 mg/250 mL  Status:  Discontinued        1,250 mg 166.7 mL/hr over 90 Minutes Intravenous Every 24 hours 10/11/21 1943 10/12/21 0312   10/11/21 2200  ceFEPIme (MAXIPIME) 2 g in sodium chloride 0.9 % 100 mL IVPB  Status:  Discontinued        2 g 200 mL/hr over 30 Minutes Intravenous Every 12 hours 10/11/21 1923 10/12/21 0312   10/11/21 1930  vancomycin (VANCOREADY) IVPB 2000 mg/400 mL        2,000 mg 200 mL/hr over 120 Minutes Intravenous  Once 10/11/21 1923 10/11/21 2249   10/11/21 1915  ceFEPIme (MAXIPIME) 2 g in sodium chloride 0.9 % 100 mL IVPB  Status:  Discontinued        2 g 200 mL/hr over 30 Minutes Intravenous  Once 10/11/21 1907 10/11/21 1923   10/11/21 1915  metroNIDAZOLE (FLAGYL) IVPB 500 mg        500 mg 100 mL/hr over 60 Minutes Intravenous  Once 10/11/21 1907 10/11/21 2111   10/11/21 1915  vancomycin (VANCOCIN) IVPB 1000 mg/200 mL premix  Status:  Discontinued        1,000 mg 200 mL/hr over 60 Minutes Intravenous  Once 10/11/21 1907 10/11/21 1923       Objective: Vitals:   10/13/21 0505 10/13/21 1016  BP: (!) 129/55 (!) 144/65  Pulse: 75 84  Resp: 17 17  Temp: 99.5 F (37.5 C) 97.9 F (36.6 C)  SpO2: 94% 98%    Intake/Output Summary (Last 24 hours) at 10/13/2021 1338 Last data filed at 10/13/2021 1258 Gross per 24 hour  Intake 3482.25 ml  Output 2710 ml  Net 772.25 ml   Filed Weights   10/11/21 1919 10/12/21 0450 10/12/21 1510  Weight: 104.3 kg 99.8 kg 99.6 kg   Weight change: -4.717 kg Body mass index is 29.78 kg/m.   Physical Exam: General exam: Pleasant, elderly Caucasian male.  Not in distress Skin: No rashes, lesions or ulcers. HEENT: Atraumatic, normocephalic, no obvious bleeding Lungs: Clear to auscultation bilaterally CVS: Regular rate and rhythm, no murmur GI/Abd soft, nontender, nondistended, bowel sound  present CNS: Alert, awake, oriented to place and person. Psychiatry: Mood appropriate Extremities: No pedal edema, no calf tenderness  Data Review: I have personally reviewed the laboratory data and studies available.  F/u labs ordered Unresulted Labs (From admission, onward)     Start     Ordered   10/13/21 0500  CBC with Differential/Platelet  Daily,   R      10/12/21 0751   10/13/21 [id number]  Basic metabolic  panel  Daily,   R      10/12/21 0751   10/12/21 0311  C Difficile Quick Screen w PCR reflex  (C Difficile quick screen w PCR reflex panel )  Once, for 24 hours,   TIMED       References:    CDiff Information Tool   10/12/21 0312   10/12/21 0311  Gastrointestinal Panel by PCR , Stool  (Gastrointestinal Panel by PCR, Stool                                                                                                                                                     **Does Not include CLOSTRIDIUM DIFFICILE testing. **If CDIFF testing is needed, place order from the "C Difficile Testing" order set.**)  Once,   R        10/12/21 0312            Signed, Terrilee Croak, MD [facility] 10/13/2021

## 2021-10-14 DIAGNOSIS — G9341 Metabolic encephalopathy: Secondary | ICD-10-CM

## 2021-10-14 LAB — CBC WITH DIFFERENTIAL/PLATELET
Abs Immature Granulocytes: 0.02 10*3/uL (ref 0.00–0.07)
Basophils Absolute: 0 10*3/uL (ref 0.0–0.1)
Basophils Relative: 0 %
Eosinophils Absolute: 0.1 10*3/uL (ref 0.0–0.5)
Eosinophils Relative: 1 %
HCT: 31.3 % — ABNORMAL LOW (ref 39.0–52.0)
Hemoglobin: 10.1 g/dL — ABNORMAL LOW (ref 13.0–17.0)
Immature Granulocytes: 0 %
Lymphocytes Relative: 20 %
Lymphs Abs: 1.2 10*3/uL (ref 0.7–4.0)
MCH: 29.7 pg (ref 26.0–34.0)
MCHC: 32.3 g/dL (ref 30.0–36.0)
MCV: 92.1 fL (ref 80.0–100.0)
Monocytes Absolute: 0.6 10*3/uL (ref 0.1–1.0)
Monocytes Relative: 10 %
Neutro Abs: 4 10*3/uL (ref 1.7–7.7)
Neutrophils Relative %: 69 %
Platelets: 118 10*3/uL — ABNORMAL LOW (ref 150–400)
RBC: 3.4 MIL/uL — ABNORMAL LOW (ref 4.22–5.81)
RDW: 14.7 % (ref 11.5–15.5)
WBC: 5.9 10*3/uL (ref 4.0–10.5)
nRBC: 0 % (ref 0.0–0.2)

## 2021-10-14 LAB — BASIC METABOLIC PANEL
Anion gap: 7 (ref 5–15)
BUN: 16 mg/dL (ref 8–23)
CO2: 23 mmol/L (ref 22–32)
Calcium: 8.1 mg/dL — ABNORMAL LOW (ref 8.9–10.3)
Chloride: 105 mmol/L (ref 98–111)
Creatinine, Ser: 1.2 mg/dL (ref 0.61–1.24)
GFR, Estimated: 60 mL/min — ABNORMAL LOW (ref >60)
Glucose, Bld: 128 mg/dL — ABNORMAL HIGH (ref 70–99)
Potassium: 3.7 mmol/L (ref 3.5–5.1)
Sodium: 135 mmol/L (ref 135–145)

## 2021-10-14 LAB — URINE CULTURE: Culture: >=100000 — AB

## 2021-10-14 MED ORDER — CEPHALEXIN 500 MG PO CAPS: 500.0000 mg | ORAL_CAPSULE | Freq: Two times a day (BID) | ORAL | 0 refills | Status: DC

## 2021-10-14 MED ORDER — SODIUM CHLORIDE 0.9 % IV SOLN
1.0000 g | Freq: Once | INTRAVENOUS | Status: AC
  Administered 2021-10-14: 1 g via INTRAVENOUS
  Filled 2021-10-14: qty 10

## 2021-10-14 NOTE — Evaluation (Signed)
Occupational Therapy Evaluation Patient Details Name: Brent Kirk MRN: [id number] DOB: 03/08/1935 Today's Date: 10/14/2021   History of Present Illness 87 y/o male presented to ED on 10/11/21 for confusion and lethargy. Admitted for sepsis 2/2 UTI. PMH: HTN, hypothyroidism   Clinical Impression   Prior to this admission, patient living with his wife and reports independence with ADLs and IADLs. Patient reports he takes care of his wife, walks with a rollator at baseline, and still drives. Currently, patient presenting with decreased activity tolerance, impulsivity, and altered mental status. Patient getting tangled in his catheter tube due to poor awareness, and decreased safety with all transfers and mobility. Patient is a min guard for ADLs, however OT promoting need for 24/7 support for safe discharge home. OT will continue to follow acutely, with the recommendation made for [facility] to promote increased independence at discharge.      Recommendations for follow up therapy are one component of a multi-disciplinary discharge planning process, led by the attending physician.  Recommendations may be updated based on patient status, additional functional criteria and insurance authorization.   Follow Up Recommendations  Home health OT    Assistance Recommended at Discharge Frequent or constant Supervision/Assistance  Patient can return home with the following A little help with walking and/or transfers;A little help with bathing/dressing/bathroom;Assistance with cooking/housework;Direct supervision/assist for medications management;Direct supervision/assist for financial management;Assist for transportation;Help with stairs or ramp for entrance    Functional Status Assessment  Patient has had a recent decline in their functional status and demonstrates the ability to make significant improvements in function in a reasonable and predictable amount of time.  Equipment Recommendations  None  recommended by OT    Recommendations for Other Services       Precautions / Restrictions Precautions Precautions: Fall Restrictions Weight Bearing Restrictions: No      Mobility Bed Mobility Overal bed mobility: Needs Assistance Bed Mobility: Supine to Sit     Supine to sit: Supervision          Transfers Overall transfer level: Needs assistance Equipment used: Rolling walker (2 wheels) Transfers: Sit to/from Stand Sit to Stand: Min guard           General transfer comment: min guard for safety. Cues for hand placement but poor follow through, wrapping himself in catheter tube due to impsulsivity      Balance Overall balance assessment: Needs assistance Sitting-balance support: No upper extremity supported, Feet supported Sitting balance-Leahy Scale: Good     Standing balance support: Bilateral upper extremity supported, Reliant on assistive device for balance Standing balance-Leahy Scale: Poor Standing balance comment: reliant on UE support                           ADL either performed or assessed with clinical judgement   ADL Overall ADL's : Needs assistance/impaired Eating/Feeding: Set up;Sitting   Grooming: Wash/dry face;Wash/dry hands;Oral care;Standing Grooming Details (indicate cue type and reason): propping elbows on sink to complete Upper Body Bathing: Set up;Sitting   Lower Body Bathing: Min guard;Minimal assistance;Sit to/from stand;Sitting/lateral leans   Upper Body Dressing : Set up;Sitting   Lower Body Dressing: Min guard;Minimal assistance;Sitting/lateral leans;Sit to/from stand Lower Body Dressing Details (indicate cue type and reason): donning socks with significant effort Toilet Transfer: Min guard;Ambulation;Rolling walker (2 wheels) Toilet Transfer Details (indicate cue type and reason): simulated with recliner Toileting- Clothing Manipulation and Hygiene: Min guard;Sitting/lateral lean;Sit to/from stand  Functional mobility during ADLs: Min guard;Rolling walker (2 wheels);Cueing for safety;Cueing for sequencing General ADL Comments: Patient presenting with decreased activity tolerance, impulsivity, and altered mental status     Vision Baseline Vision/History: 0 No visual deficits Ability to See in Adequate Light: 0 Adequate Patient Visual Report: No change from baseline       Perception     Praxis      Pertinent Vitals/Pain Pain Assessment Pain Assessment: No/denies pain     Hand Dominance     Extremity/Trunk Assessment Upper Extremity Assessment Upper Extremity Assessment: Generalized weakness   Lower Extremity Assessment Lower Extremity Assessment: Defer to PT evaluation   Cervical / Trunk Assessment Cervical / Trunk Assessment: Kyphotic   Communication Communication Communication: No difficulties   Cognition Arousal/Alertness: Awake/alert Behavior During Therapy: Impulsive Overall Cognitive Status: Impaired/Different from baseline Area of Impairment: Attention, Memory, Following commands, Safety/judgement, Awareness, Problem solving                   Current Attention Level: Sustained Memory: Decreased short-term memory Following Commands: Follows one step commands inconsistently Safety/Judgement: Decreased awareness of safety, Decreased awareness of deficits Awareness: Intellectual Problem Solving: Slow processing General Comments: very impulsive and intermittently following commands with very poor safety awareness. General confusion noted but no family present to determine baseline     General Comments       Exercises     Shoulder Instructions      Home Living Family/patient expects to be discharged to:: Private residence Living Arrangements: Spouse/significant other Available Help at Discharge: Family Type of Home: House Home Access: Level entry     Home Layout: Two level Alternate Level Stairs-Number of Steps: flight but has a Financial trader Shower/Tub: Arts development officer Toilet: Handicapped height     Home Equipment: Pharmacist, hospital (2 wheels);Rollator (4 wheels);Wheelchair - manual   Additional Comments: states he enters his home through the basement and uses chair lift to go up the stairs. He reports he has been caring for his wife but unsure of accuracy      Prior Functioning/Environment Prior Level of Function : Independent/Modified Independent             Mobility Comments: uses rollator for mobility ADLs Comments: reports independence, still drives, and tends to his garden        OT Problem List: Decreased activity tolerance;Impaired balance (sitting and/or standing);Decreased strength;Decreased coordination;Decreased cognition;Decreased safety awareness;Decreased knowledge of use of DME or AE;Decreased knowledge of precautions      OT Treatment/Interventions: Self-care/ADL training;Therapeutic exercise;Energy conservation;DME and/or AE instruction;Manual therapy;Therapeutic activities;Cognitive remediation/compensation;Patient/family education;Balance training    OT Goals(Current goals can be found in the care plan section) Acute Rehab OT Goals Patient Stated Goal: to get back home OT Goal Formulation: With patient Time For Goal Achievement: 10/28/21 Potential to Achieve Goals: Fair ADL Goals Pt Will Perform Lower Body Bathing: Independently;sit to/from stand;sitting/lateral leans Pt Will Perform Lower Body Dressing: Independently;sitting/lateral leans;sit to/from stand Pt Will Transfer to Toilet: Independently;ambulating Additional ADL Goal #1: Patient will demonstrate ability to follow 2-3 step trail making task without cues to recall to promote safe discharge home. Additional ADL Goal #2: Patient will complete pillbox test with 0 errors to be able to safely manage medications at home indpendently.  OT Frequency: Min 2X/week    Co-evaluation               AM-PAC OT "6 Clicks" Daily Activity     Outcome Measure  Help from another person eating meals?: A Little Help from another person taking care of personal grooming?: A Little Help from another person toileting, which includes using toliet, bedpan, or urinal?: A Little Help from another person bathing (including washing, rinsing, drying)?: A Little Help from another person to put on and taking off regular upper body clothing?: A Little Help from another person to put on and taking off regular lower body clothing?: A Little 6 Click Score: 18   End of Session Equipment Utilized During Treatment: Rolling walker (2 wheels) Nurse Communication: Mobility status  Activity Tolerance: Patient tolerated treatment well Patient left: in chair;with call bell/phone within reach;with chair alarm set  OT Visit Diagnosis: Unsteadiness on feet (R26.81);Other abnormalities of gait and mobility (R26.89);Muscle weakness (generalized) (M62.81);History of falling (Z91.81)                Time: 0272-5366 OT Time Calculation (min): 19 min Charges:  OT General Charges $OT Visit: 1 Visit OT Evaluation $OT Eval Moderate Complexity: 1 Mod  Pollyann Glen E. Francina Beery, OTR/L Acute Rehabilitation Services [phone]   Cherlyn Cushing 10/14/2021, 12:15 PM

## 2021-10-14 NOTE — Plan of Care (Signed)

## 2021-10-14 NOTE — Discharge Summary (Signed)
Physician Discharge Summary  Brent Kirk:[id number] DOB: [birth year] DOA: 10/11/2021  PCP: Marden Noble, MD  Admit date: 10/11/2021 Discharge date: 10/14/2021  Admitted From: Home Disposition: Home  Recommendations for Outpatient Follow-up:  Follow up with PCP in 1-2 weeks   Home Health: N/A Equipment/Devices: N/A  Discharge Condition: Stable CODE STATUS: Full code Diet recommendation: Regular diet  Discharge summary: Patient has history of hypertension, hyperlipidemia, hypothyroidism who was admitted to the hospital on 6/7 with altered mental status, fever and lethargy and increasing urinary frequency and urgency.  On presentation, temperature 102, heart rate 115, WC count 14.8.  He was admitted to hospital treated with IV fluids, treated with Rocephin.  Urine culture grew pansensitive E. coli.  Blood cultures negative.  Received 3 days of Rocephin, clinically improved and normalized.  No evidence of urinary retention.  Discharging home with Keflex for 7 more days.  Resume all home medications.   Discharge Diagnoses:  Principal Problem:   UTI (urinary tract infection) Active Problems:   Sepsis (HCC)   Acute metabolic encephalopathy   Diarrhea   Anemia   Thrombocytopenia (HCC)   HTN (hypertension)   HLD (hyperlipidemia)   Hypothyroidism    Discharge Instructions  Discharge Instructions     Call MD for:  temperature >100.4   Complete by: As directed    Diet general   Complete by: As directed    Increase activity slowly   Complete by: As directed       Allergies as of 10/14/2021   No Known Allergies      Medication List     TAKE these medications    acetaminophen 500 MG tablet Commonly known as: TYLENOL Take 500-1,000 mg by mouth every 6 (six) hours as needed for mild pain or headache.   cephALEXin 500 MG capsule Commonly known as: KEFLEX Take 1 capsule (500 mg total) by mouth 2 (two) times daily.   levothyroxine 75 MCG tablet Commonly known as:  SYNTHROID Take 75 mcg by mouth daily before breakfast.   rosuvastatin 5 MG tablet Commonly known as: CRESTOR Take 5 mg by mouth daily.   valsartan-hydrochlorothiazide 160-12.5 MG tablet Commonly known as: DIOVAN-HCT Take 1 tablet by mouth daily.        No Known Allergies  Consultations: None   Procedures/Studies: CT HEAD WO CONTRAST ( )  Result Date: 10/12/2021 CLINICAL DATA:  Delirium, confusion. EXAM: CT HEAD WITHOUT CONTRAST TECHNIQUE: Contiguous axial images were obtained from the base of the skull through the vertex without intravenous contrast. RADIATION DOSE REDUCTION: This exam was performed according to the departmental dose-optimization program which includes automated exposure control, adjustment of the mA and/or kV according to patient size and/or use of iterative reconstruction technique. COMPARISON:  None Available. FINDINGS: Brain: No acute intracranial hemorrhage, midline shift or mass effect. No extra-axial fluid collection. Mild periventricular white matter hypodensities are seen bilaterally. No hydrocephalus Vascular: No hyperdense vessel or unexpected calcification. Skull: Normal. Negative for fracture or focal lesion. Sinuses/Orbits: Mild mucosal thickening in the frontal sinuses and ethmoid air cells bilaterally and right maxillary sinus. No acute orbital abnormality. Other: None. IMPRESSION: 1. No acute intracranial process. 2. Mild chronic microvascular ischemic changes. Electronically Signed   By: Thornell Sartorius M.D.   On: 10/12/2021 03:54   DG Chest Portable 1 View  Result Date: 10/11/2021 CLINICAL DATA:  Shortness of breath and sepsis. EXAM: PORTABLE CHEST 1 VIEW COMPARISON:  07/08/2014 FINDINGS: The heart size and mediastinal contours are within normal limits. There is no  evidence of pulmonary edema, consolidation, pneumothorax or pleural fluid. The visualized skeletal structures are unremarkable. IMPRESSION: No active disease. Electronically Signed   By: Irish Lack M.D.   On: 10/11/2021 18:48   (Echo, Carotid, EGD, Colonoscopy, ERCP)    Subjective: Patient seen and examined.  Complains of some soreness on fitting dentures but otherwise denies any complaints.  Eager to go home.  Called and updated patient's wife and she is eager for him to be discharged.  We will give a dose of antibiotics today as injectable before discharge.   Discharge Exam: Vitals:   10/14/21 0339 10/14/21 0918  BP: (!) 113/38 (!) 162/71  Pulse: 70 68  Resp: 18 18  Temp: 98.3 F (36.8 C) 97.8 F (36.6 C)  SpO2: 94% 97%   Vitals:   10/13/21 1616 10/13/21 2100 10/14/21 0339 10/14/21 0918  BP: (!) 164/75 (!) 160/73 (!) 113/38 (!) 162/71  Pulse: 80 85 70 68  Resp: Temp: 98.4 F (36.9 C) 99.9 F (37.7 C) 98.3 F (36.8 C) 97.8 F (36.6 C)  TempSrc:  Oral Oral Oral  SpO2: 97% 95% 94% 97%  Weight:      Height:        General: Pt is alert, awake, not in acute distress Pleasant.  Conversant. Cardiovascular: RRR, S1/S2 +, no rubs, no gallops Respiratory: CTA bilaterally, no wheezing, no rhonchi Abdominal: Soft, NT, ND, bowel sounds + Extremities: no edema, no cyanosis Urine is clear on collection bag.  Patient has a condom catheter.    The results of significant diagnostics from this hospitalization (including imaging, microbiology, ancillary and laboratory) are listed below for reference.     Microbiology: Recent Results (from the past 240 hour(s))  Blood Culture (routine x 2)     Status: None (Preliminary result)   Collection Time: 10/11/21  6:06 PM   Specimen: BLOOD  Result Value Ref Range Status   Specimen Description BLOOD LEFT ANTECUBITAL  Final   Special Requests   Final    BOTTLES DRAWN AEROBIC AND ANAEROBIC Blood Culture adequate volume   Culture   Final    NO GROWTH 3 DAYS Performed at [facility], [address], [place], [place] [address]    Report Status PENDING  Incomplete  Blood Culture (routine x 2)     Status:  None (Preliminary result)   Collection Time: 10/11/21  6:07 PM   Specimen: BLOOD  Result Value Ref Range Status   Specimen Description BLOOD RIGHT ANTECUBITAL  Final   Special Requests   Final    BOTTLES DRAWN AEROBIC AND ANAEROBIC Blood Culture results may not be optimal due to an excessive volume of blood received in culture bottles   Culture   Final    NO GROWTH 3 DAYS Performed at [facility], [address] [place], [place] [address]    Report Status PENDING  Incomplete  Urine Culture     Status: Abnormal   Collection Time: 10/11/21  8:36 PM   Specimen: In/Out Cath Urine  Result Value Ref Range Status   Specimen Description IN/OUT CATH URINE  Final   Special Requests   Final    NONE Performed at [facility], [address] [place], [place] [address]    Culture >=100,000 COLONIES/mL ESCHERICHIA COLI (A)  Final   Report Status 10/14/2021 FINAL  Final   Organism ID, Bacteria ESCHERICHIA COLI (A)  Final      Susceptibility  Escherichia coli - MIC*    AMPICILLIN >=32 RESISTANT Resistant     CEFAZOLIN <=4 SENSITIVE Sensitive     CEFEPIME <=0.12 SENSITIVE Sensitive     CEFTRIAXONE <=0.25 SENSITIVE Sensitive     CIPROFLOXACIN <=0.25 SENSITIVE Sensitive     GENTAMICIN <=1 SENSITIVE Sensitive     IMIPENEM <=0.25 SENSITIVE Sensitive     NITROFURANTOIN <=16 SENSITIVE Sensitive     TRIMETH/SULFA >=320 RESISTANT Resistant     AMPICILLIN/SULBACTAM 16 INTERMEDIATE Intermediate     PIP/TAZO <=4 SENSITIVE Sensitive     * >=100,000 COLONIES/mL ESCHERICHIA COLI  Resp Panel by RT-PCR (Flu A&B, Covid) Anterior Nasal Swab     Status: None   Collection Time: 10/11/21  8:36 PM   Specimen: Anterior Nasal Swab  Result Value Ref Range Status   SARS Coronavirus 2 by RT PCR NEGATIVE NEGATIVE Final    Comment: (NOTE) SARS-CoV-2 target nucleic acids are NOT DETECTED.  The SARS-CoV-2 RNA is generally detectable in upper respiratory specimens during the acute phase of infection.  The lowest concentration of SARS-CoV-2 viral copies this assay can detect is 138 copies/mL. A negative result does not preclude SARS-Cov-2 infection and should not be used as the sole basis for treatment or other patient management decisions. A negative result may occur with  improper specimen collection/handling, submission of specimen other than nasopharyngeal swab, presence of viral mutation(s) within the areas targeted by this assay, and inadequate number of viral copies(<138 copies/mL). A negative result must be combined with clinical observations, patient history, and epidemiological information. The expected result is Negative.  Fact Sheet for Patients:  BloggerCourse.com  Fact Sheet for Healthcare Providers:  SeriousBroker.it  This test is no t yet approved or cleared by the [place] FDA and  has been authorized for detection and/or diagnosis of SARS-CoV-2 by FDA under an Emergency Use Authorization (EUA). This EUA will remain  in effect (meaning this test can be used) for the duration of the COVID-19 declaration under Section 564(b)(1) of the Act, 21 U.S.C.section 360bbb-3(b)(1), unless the authorization is terminated  or revoked sooner.       Influenza A by PCR NEGATIVE NEGATIVE Final   Influenza B by PCR NEGATIVE NEGATIVE Final    Comment: (NOTE) The Xpert Xpress SARS-CoV-2/FLU/RSV plus assay is intended as an aid in the diagnosis of influenza from Nasopharyngeal swab specimens and should not be used as a sole basis for treatment. Nasal washings and aspirates are unacceptable for Xpert Xpress SARS-CoV-2/FLU/RSV testing.  Fact Sheet for Patients: BloggerCourse.com  Fact Sheet for Healthcare Providers: SeriousBroker.it  This test is not yet approved or cleared by the [place] FDA and has been authorized for detection and/or diagnosis of SARS-CoV-2 by FDA  under an Emergency Use Authorization (EUA). This EUA will remain in effect (meaning this test can be used) for the duration of the COVID-19 declaration under Section 564(b)(1) of the Act, 21 U.S.C. section 360bbb-3(b)(1), unless the authorization is terminated or revoked.  Performed at [facility], [address] [place], [place] [address]      Labs: BNP (last 3 results) No results for input(s): "BNP" in the last 8760 hours. Basic Metabolic Panel: Recent Labs  Lab 10/11/21 1807 10/12/21 0500 10/13/21 0339 10/14/21 0230  NA 139 138 136 135  K 4.4 4.0 3.7 3.7  CL 100 105 104 105  CO2 26 25 26 23   GLUCOSE 167* 149* 127* 128*  BUN 18 16 13 16   CREATININE 1.28* 1.21 1.15 1.20  CALCIUM 9.5 8.7* 8.2* 8.1*   Liver Function Tests: Recent Labs  Lab 10/11/21 1807  AST 17  ALT 16  ALKPHOS 52  BILITOT 0.6  PROT 7.7  ALBUMIN 4.0   No results for input(s): "LIPASE", "AMYLASE" in the last 168 hours. No results for input(s): "AMMONIA" in the last 168 hours. CBC: Recent Labs  Lab 10/11/21 1807 10/12/21 0500 10/13/21 0339 10/14/21 0230  WBC 14.8* 17.2* 10.5 5.9  NEUTROABS 12.8*  --  8.2* 4.0  HGB 12.2* 11.2* 10.0* 10.1*  HCT 38.9* 34.6* 31.2* 31.3*  MCV 94.4 93.0 91.5 92.1  PLT 132* 108* 108* 118*   Cardiac Enzymes: No results for input(s): "CKTOTAL", "CKMB", "CKMBINDEX", "TROPONINI" in the last 168 hours. BNP: Invalid input(s): "POCBNP" CBG: No results for input(s): "GLUCAP" in the last 168 hours. D-Dimer No results for input(s): "DDIMER" in the last 72 hours. Hgb A1c No results for input(s): "HGBA1C" in the last 72 hours. Lipid Profile No results for input(s): "CHOL", "HDL", "LDLCALC", "TRIG", "CHOLHDL", "LDLDIRECT" in the last 72 hours. Thyroid function studies Recent Labs    10/12/21 0500  TSH 2.887   Anemia work up No results for input(s): "VITAMINB12", "FOLATE", "FERRITIN", "TIBC", "IRON", "RETICCTPCT" in the last 72 hours. Urinalysis     Component Value Date/Time   COLORURINE YELLOW 10/11/2021 2036   APPEARANCEUR HAZY (A) 10/11/2021 2036   LABSPEC 1.015 10/11/2021 2036   PHURINE 5.0 10/11/2021 2036   GLUCOSEU NEGATIVE 10/11/2021 2036   HGBUR SMALL (A) 10/11/2021 2036   BILIRUBINUR NEGATIVE 10/11/2021 2036   KETONESUR NEGATIVE 10/11/2021 2036   PROTEINUR NEGATIVE 10/11/2021 2036   NITRITE POSITIVE (A) 10/11/2021 2036   LEUKOCYTESUR MODERATE (A) 10/11/2021 2036   Sepsis Labs Recent Labs  Lab 10/11/21 1807 10/12/21 0500 10/13/21 0339 10/14/21 0230  WBC 14.8* 17.2* 10.5 5.9   Microbiology Recent Results (from the past 240 hour(s))  Blood Culture (routine x 2)     Status: None (Preliminary result)   Collection Time: 10/11/21  6:06 PM   Specimen: BLOOD  Result Value Ref Range Status   Specimen Description BLOOD LEFT ANTECUBITAL  Final   Special Requests   Final    BOTTLES DRAWN AEROBIC AND ANAEROBIC Blood Culture adequate volume   Culture   Final    NO GROWTH 3 DAYS Performed at [facility], [address] [place], [place] [address]    Report Status PENDING  Incomplete  Blood Culture (routine x 2)     Status: None (Preliminary result)   Collection Time: 10/11/21  6:07 PM   Specimen: BLOOD  Result Value Ref Range Status   Specimen Description BLOOD RIGHT ANTECUBITAL  Final   Special Requests   Final    BOTTLES DRAWN AEROBIC AND ANAEROBIC Blood Culture results may not be optimal due to an excessive volume of blood received in culture bottles   Culture   Final    NO GROWTH 3 DAYS Performed at [facility], [address] [place], [place] [address]    Report Status PENDING  Incomplete  Urine Culture     Status: Abnormal   Collection Time: 10/11/21  8:36 PM   Specimen: In/Out Cath Urine  Result Value Ref Range Status   Specimen Description IN/OUT CATH URINE  Final   Special Requests   Final    NONE Performed at [facility], [address] [place], [place] [address]    Culture  >=100,000 COLONIES/mL ESCHERICHIA COLI (A)  Final  Report Status 10/14/2021 FINAL  Final   Organism ID, Bacteria ESCHERICHIA COLI (A)  Final      Susceptibility   Escherichia coli - MIC*    AMPICILLIN >=32 RESISTANT Resistant     CEFAZOLIN <=4 SENSITIVE Sensitive     CEFEPIME <=0.12 SENSITIVE Sensitive     CEFTRIAXONE <=0.25 SENSITIVE Sensitive     CIPROFLOXACIN <=0.25 SENSITIVE Sensitive     GENTAMICIN <=1 SENSITIVE Sensitive     IMIPENEM <=0.25 SENSITIVE Sensitive     NITROFURANTOIN <=16 SENSITIVE Sensitive     TRIMETH/SULFA >=320 RESISTANT Resistant     AMPICILLIN/SULBACTAM 16 INTERMEDIATE Intermediate     PIP/TAZO <=4 SENSITIVE Sensitive     * >=100,000 COLONIES/mL ESCHERICHIA COLI  Resp Panel by RT-PCR (Flu A&B, Covid) Anterior Nasal Swab     Status: None   Collection Time: 10/11/21  8:36 PM   Specimen: Anterior Nasal Swab  Result Value Ref Range Status   SARS Coronavirus 2 by RT PCR NEGATIVE NEGATIVE Final    Comment: (NOTE) SARS-CoV-2 target nucleic acids are NOT DETECTED.  The SARS-CoV-2 RNA is generally detectable in upper respiratory specimens during the acute phase of infection. The lowest concentration of SARS-CoV-2 viral copies this assay can detect is 138 copies/mL. A negative result does not preclude SARS-Cov-2 infection and should not be used as the sole basis for treatment or other patient management decisions. A negative result may occur with  improper specimen collection/handling, submission of specimen other than nasopharyngeal swab, presence of viral mutation(s) within the areas targeted by this assay, and inadequate number of viral copies(<138 copies/mL). A negative result must be combined with clinical observations, patient history, and epidemiological information. The expected result is Negative.  Fact Sheet for Patients:  BloggerCourse.com  Fact Sheet for Healthcare Providers:   SeriousBroker.it  This test is no t yet approved or cleared by the [place] FDA and  has been authorized for detection and/or diagnosis of SARS-CoV-2 by FDA under an Emergency Use Authorization (EUA). This EUA will remain  in effect (meaning this test can be used) for the duration of the COVID-19 declaration under Section 564(b)(1) of the Act, 21 U.S.C.section 360bbb-3(b)(1), unless the authorization is terminated  or revoked sooner.       Influenza A by PCR NEGATIVE NEGATIVE Final   Influenza B by PCR NEGATIVE NEGATIVE Final    Comment: (NOTE) The Xpert Xpress SARS-CoV-2/FLU/RSV plus assay is intended as an aid in the diagnosis of influenza from Nasopharyngeal swab specimens and should not be used as a sole basis for treatment. Nasal washings and aspirates are unacceptable for Xpert Xpress SARS-CoV-2/FLU/RSV testing.  Fact Sheet for Patients: BloggerCourse.com  Fact Sheet for Healthcare Providers: SeriousBroker.it  This test is not yet approved or cleared by the [place] FDA and has been authorized for detection and/or diagnosis of SARS-CoV-2 by FDA under an Emergency Use Authorization (EUA). This EUA will remain in effect (meaning this test can be used) for the duration of the COVID-19 declaration under Section 564(b)(1) of the Act, 21 U.S.C. section 360bbb-3(b)(1), unless the authorization is terminated or revoked.  Performed at [facility], [address] [place] [address], [place], [place] [address]      Time coordinating discharge: 35 minutes  SIGNED:   Dorcas Carrow, MD  [facility] 10/14/2021, 12:31 PM

## 2021-10-14 NOTE — TOC Transition Note (Addendum)
Transition of Care [facility]) - CM/SW Discharge Note   Patient Details  Name: Brent Kirk MRN: [id number] Date of Birth: [birth year]  Transition of Care [facility] CM/SW Contact:  Bess Kinds, RN Phone Number: [phone] 10/14/2021, 2:54 PM   Clinical Narrative:     Patient discharged before [facility] could discuss post acute transition. Referral to [facility] accepted for [facility] PT/OT with start of care beginning on Tuesday 6/13. Spoke with patient's spouse, Brent Kirk, on her cell phone [phone] to advise of HH f/u. She provided updated cell number for patient [phone]. Current PCP is Dr. Orson Aloe. No further TOC needs identified.   Final next level of care: Home w Home Health Services Barriers to Discharge: No Barriers Identified   Patient Goals and CMS Choice Patient states their goals for this hospitalization and ongoing recovery are:: To return home CMS Medicare.gov Compare Post Acute Care list provided to:: Patient    Discharge Placement                       Discharge Plan and Services   Discharge Planning Services: CM Consult                      HH Arranged: PT, OT HH Agency: [facility] (now known as [facility]) Date HH Agency Contacted: 10/14/21 Time HH Agency Contacted: 1454 Representative spoke with at [facility] Agency: Marylene Land  Social Determinants of Health (SDOH) Interventions     Readmission Risk Interventions     No data to display

## 2021-10-14 NOTE — Progress Notes (Signed)
DISCHARGE NOTE HOME DIEZEL MAZUR to be discharged Home per MD order. Discussed prescriptions and follow up appointments with the patient. Prescriptions given to patient; medication list explained in detail. Patient verbalized understanding.  Skin clean, dry and intact without evidence of skin break down, no evidence of skin tears noted. IV catheter discontinued intact. Site without signs and symptoms of complications. Dressing and pressure applied. Pt denies pain at the site currently. No complaints noted.  Patient free of lines, drains, and wounds.   An After Visit Summary (AVS) was printed and given to the patient. Patient escorted via wheelchair, and discharged home via private auto.  Myrtis Hopping, RN

## 2021-10-14 NOTE — Progress Notes (Signed)
Physical Therapy Treatment Patient Details Name: Brent Kirk MRN: [id number] DOB: [birth year] Today's Date: 10/14/2021   History of Present Illness 87 y/o male presented to ED on 10/11/21 for confusion and lethargy. Admitted for sepsis 2/2 UTI. PMH: HTN, hypothyroidism    PT Comments    Patient seen for gait progression and education. Patient requiring min guard for ambulation with RW for safety as patient is impulsive and at increased fall risk. Educated patient on high fall risk due to current mobility status and need for assistance for mobility, patient verbalized understanding but quickly disagreed. Patient continues to be limited by cognitive deficits specifically in awareness, safety, and attention. D/c plan remains appropriate.     Recommendations for follow up therapy are one component of a multi-disciplinary discharge planning process, led by the attending physician.  Recommendations may be updated based on patient status, additional functional criteria and insurance authorization.  Follow Up Recommendations  Home health PT     Assistance Recommended at Discharge Frequent or constant Supervision/Assistance  Patient can return home with the following A little help with walking and/or transfers;A little help with bathing/dressing/bathroom;Assistance with cooking/housework;Direct supervision/assist for medications management;Direct supervision/assist for financial management;Assist for transportation;Help with stairs or ramp for entrance   Equipment Recommendations  Rolling Cooper Moroney (2 wheels)    Recommendations for Other Services       Precautions / Restrictions Precautions Precautions: Fall Restrictions Weight Bearing Restrictions: No     Mobility  Bed Mobility Overal bed mobility: Needs Assistance Bed Mobility: Sit to Supine       Sit to supine: Supervision        Transfers Overall transfer level: Needs assistance Equipment used: Rolling Ivyonna Hoelzel (2  wheels) Transfers: Sit to/from Stand Sit to Stand: Min guard           General transfer comment: min guard for safety. Cues for safety and hand placement on RW once in standing    Ambulation/Gait Ambulation/Gait assistance: Min guard Gait Distance (Feet): 120 Feet Assistive device: Rolling S[place]ina Streets (2 wheels) Gait Pattern/deviations: Step-through pattern, Decreased stride length, Decreased stance time - right, Decreased weight shift to right, Trunk flexed Gait velocity: decreased     General Gait Details: cues for close proximity to RW and sequencing of RW. Min guard for safety as patient is unsteady and at increased risk for falls with poor awareness   Stairs             Wheelchair Mobility    Modified Rankin (Stroke Patients Only)       Balance Overall balance assessment: Needs assistance Sitting-balance support: No upper extremity supported, Feet supported Sitting balance-Leahy Scale: Good     Standing balance support: Bilateral upper extremity supported, Reliant on assistive device for balance Standing balance-Leahy Scale: Poor Standing balance comment: reliant on UE support                            Cognition Arousal/Alertness: Awake/alert Behavior During Therapy: Impulsive Overall Cognitive Status: Impaired/Different from baseline Area of Impairment: Attention, Memory, Following commands, Safety/judgement, Awareness, Problem solving                   Current Attention Level: Sustained Memory: Decreased short-term memory Following Commands: Follows one step commands inconsistently Safety/Judgement: Decreased awareness of safety, Decreased awareness of deficits Awareness: Intellectual Problem Solving: Slow processing General Comments: very impulsive and intermittently following commands with very poor safety awareness. General confusion noted but no family  present to determine baseline        Exercises Other Exercises Other  Exercises: sit to stand x 5    General Comments        Pertinent Vitals/Pain Pain Assessment Pain Assessment: No/denies pain    Home Living Family/patient expects to be discharged to:: Private residence Living Arrangements: Spouse/significant other Available Help at Discharge: Family Type of Home: House Home Access: Level entry     Alternate Level Stairs-Number of Steps: flight but has a chair lift Home Layout: Two level Home Equipment: Pharmacist, hospital (2 wheels);Rollator (4 wheels);Wheelchair - manual Additional Comments: states he enters his home through the basement and uses chair lift to go up the stairs. He reports he has been caring for his wife but unsure of accuracy    Prior Function            PT Goals (current goals can now be found in the care plan section) Acute Rehab PT Goals PT Goal Formulation: With patient Time For Goal Achievement: 10/27/21 Potential to Achieve Goals: Fair Progress towards PT goals: Progressing toward goals    Frequency    Min 3X/week      PT Plan Current plan remains appropriate    Co-evaluation              AM-PAC PT "6 Clicks" Mobility   Outcome Measure  Help needed turning from your back to your side while in a flat bed without using bedrails?: A Little Help needed moving from lying on your back to sitting on the side of a flat bed without using bedrails?: A Little Help needed moving to and from a bed to a chair (including a wheelchair)?: A Little Help needed standing up from a chair using your arms (e.g., wheelchair or bedside chair)?: A Little Help needed to walk in hospital room?: A Little Help needed climbing 3-5 steps with a railing? : A Lot 6 Click Score: 17    End of Session Equipment Utilized During Treatment: Gait belt Activity Tolerance: Patient tolerated treatment well Patient left: in bed;with call bell/phone within reach;with bed alarm set Nurse Communication: Mobility status PT Visit  Diagnosis: Unsteadiness on feet (R26.81);Muscle weakness (generalized) (M62.81);Other abnormalities of gait and mobility (R26.89)     Time: 1112-1140 PT Time Calculation (min) (ACUTE ONLY): 28 min  Charges:  $Gait Training: 8-22 mins $Therapeutic Activity: 8-22 mins                     Angus Amini A. Dan Humphreys PT, DPT Acute Rehabilitation Services Office [phone]    Viviann Spare 10/14/2021, 12:37 PM

## 2021-10-16 LAB — CULTURE, BLOOD (ROUTINE X 2)
Culture: NO GROWTH
Culture: NO GROWTH
Special Requests: ADEQUATE

## 2021-10-17 DIAGNOSIS — I10 Essential (primary) hypertension: Secondary | ICD-10-CM | POA: Diagnosis not present

## 2021-10-17 DIAGNOSIS — D649 Anemia, unspecified: Secondary | ICD-10-CM | POA: Diagnosis not present

## 2021-10-17 DIAGNOSIS — A419 Sepsis, unspecified organism: Secondary | ICD-10-CM | POA: Diagnosis not present

## 2021-10-17 DIAGNOSIS — N390 Urinary tract infection, site not specified: Secondary | ICD-10-CM | POA: Diagnosis not present

## 2021-10-17 DIAGNOSIS — G9341 Metabolic encephalopathy: Secondary | ICD-10-CM | POA: Diagnosis not present

## 2021-10-18 DIAGNOSIS — J310 Chronic rhinitis: Secondary | ICD-10-CM | POA: Diagnosis not present

## 2021-10-18 DIAGNOSIS — N390 Urinary tract infection, site not specified: Secondary | ICD-10-CM | POA: Diagnosis not present

## 2021-10-18 DIAGNOSIS — E559 Vitamin D deficiency, unspecified: Secondary | ICD-10-CM | POA: Diagnosis not present

## 2021-10-19 DIAGNOSIS — A419 Sepsis, unspecified organism: Secondary | ICD-10-CM | POA: Diagnosis not present

## 2021-10-19 DIAGNOSIS — G9341 Metabolic encephalopathy: Secondary | ICD-10-CM | POA: Diagnosis not present

## 2021-10-19 DIAGNOSIS — N390 Urinary tract infection, site not specified: Secondary | ICD-10-CM | POA: Diagnosis not present

## 2021-10-19 DIAGNOSIS — I10 Essential (primary) hypertension: Secondary | ICD-10-CM | POA: Diagnosis not present

## 2021-10-19 DIAGNOSIS — D649 Anemia, unspecified: Secondary | ICD-10-CM | POA: Diagnosis not present

## 2021-10-27 DIAGNOSIS — I10 Essential (primary) hypertension: Secondary | ICD-10-CM | POA: Diagnosis not present

## 2021-10-27 DIAGNOSIS — A419 Sepsis, unspecified organism: Secondary | ICD-10-CM | POA: Diagnosis not present

## 2021-10-27 DIAGNOSIS — N390 Urinary tract infection, site not specified: Secondary | ICD-10-CM | POA: Diagnosis not present

## 2021-10-27 DIAGNOSIS — G9341 Metabolic encephalopathy: Secondary | ICD-10-CM | POA: Diagnosis not present

## 2021-10-27 DIAGNOSIS — D649 Anemia, unspecified: Secondary | ICD-10-CM | POA: Diagnosis not present

## 2021-10-30 DIAGNOSIS — A419 Sepsis, unspecified organism: Secondary | ICD-10-CM | POA: Diagnosis not present

## 2021-10-30 DIAGNOSIS — D649 Anemia, unspecified: Secondary | ICD-10-CM | POA: Diagnosis not present

## 2021-10-30 DIAGNOSIS — N390 Urinary tract infection, site not specified: Secondary | ICD-10-CM | POA: Diagnosis not present

## 2021-10-30 DIAGNOSIS — G9341 Metabolic encephalopathy: Secondary | ICD-10-CM | POA: Diagnosis not present

## 2021-10-30 DIAGNOSIS — I10 Essential (primary) hypertension: Secondary | ICD-10-CM | POA: Diagnosis not present

## 2021-11-01 DIAGNOSIS — A419 Sepsis, unspecified organism: Secondary | ICD-10-CM | POA: Diagnosis not present

## 2021-11-01 DIAGNOSIS — N390 Urinary tract infection, site not specified: Secondary | ICD-10-CM | POA: Diagnosis not present

## 2021-11-01 DIAGNOSIS — D649 Anemia, unspecified: Secondary | ICD-10-CM | POA: Diagnosis not present

## 2021-11-01 DIAGNOSIS — G9341 Metabolic encephalopathy: Secondary | ICD-10-CM | POA: Diagnosis not present

## 2021-11-01 DIAGNOSIS — I10 Essential (primary) hypertension: Secondary | ICD-10-CM | POA: Diagnosis not present

## 2021-11-02 DIAGNOSIS — G9341 Metabolic encephalopathy: Secondary | ICD-10-CM | POA: Diagnosis not present

## 2021-11-02 DIAGNOSIS — D649 Anemia, unspecified: Secondary | ICD-10-CM | POA: Diagnosis not present

## 2021-11-02 DIAGNOSIS — I10 Essential (primary) hypertension: Secondary | ICD-10-CM | POA: Diagnosis not present

## 2021-11-02 DIAGNOSIS — A419 Sepsis, unspecified organism: Secondary | ICD-10-CM | POA: Diagnosis not present

## 2021-11-02 DIAGNOSIS — N390 Urinary tract infection, site not specified: Secondary | ICD-10-CM | POA: Diagnosis not present

## 2021-12-06 DIAGNOSIS — R339 Retention of urine, unspecified: Secondary | ICD-10-CM | POA: Diagnosis not present

## 2021-12-06 DIAGNOSIS — N390 Urinary tract infection, site not specified: Secondary | ICD-10-CM | POA: Diagnosis not present

## 2021-12-06 DIAGNOSIS — N281 Cyst of kidney, acquired: Secondary | ICD-10-CM | POA: Diagnosis not present

## 2021-12-21 DIAGNOSIS — R339 Retention of urine, unspecified: Secondary | ICD-10-CM | POA: Diagnosis not present

## 2022-01-16 DIAGNOSIS — R339 Retention of urine, unspecified: Secondary | ICD-10-CM | POA: Diagnosis not present

## 2022-02-06 DIAGNOSIS — R339 Retention of urine, unspecified: Secondary | ICD-10-CM | POA: Diagnosis not present

## 2022-05-03 DIAGNOSIS — R339 Retention of urine, unspecified: Secondary | ICD-10-CM | POA: Diagnosis not present

## 2022-06-29 DIAGNOSIS — Z0000 Encounter for general adult medical examination without abnormal findings: Secondary | ICD-10-CM | POA: Diagnosis not present

## 2022-06-29 DIAGNOSIS — E039 Hypothyroidism, unspecified: Secondary | ICD-10-CM | POA: Diagnosis not present

## 2022-06-29 DIAGNOSIS — I10 Essential (primary) hypertension: Secondary | ICD-10-CM | POA: Diagnosis not present

## 2022-06-29 DIAGNOSIS — E7849 Other hyperlipidemia: Secondary | ICD-10-CM | POA: Diagnosis not present

## 2022-07-09 DIAGNOSIS — R339 Retention of urine, unspecified: Secondary | ICD-10-CM | POA: Diagnosis not present

## 2022-08-30 ENCOUNTER — Inpatient Hospital Stay (HOSPITAL_COMMUNITY)
Admission: EM | Admit: 2022-08-30 | Discharge: 2022-09-01 | DRG: 718 | Disposition: A | Discharge status: Home / Self Care | Payer: Medicare Other | Attending: Internal Medicine | Admitting: Internal Medicine

## 2022-08-30 ENCOUNTER — Emergency Department (HOSPITAL_COMMUNITY): Payer: Medicare Other

## 2022-08-30 ENCOUNTER — Encounter (HOSPITAL_COMMUNITY): Payer: Self-pay | Admitting: Internal Medicine

## 2022-08-30 ENCOUNTER — Other Ambulatory Visit: Payer: Self-pay

## 2022-08-30 DIAGNOSIS — N433 Hydrocele, unspecified: Secondary | ICD-10-CM | POA: Diagnosis present

## 2022-08-30 DIAGNOSIS — E039 Hypothyroidism, unspecified: Secondary | ICD-10-CM | POA: Diagnosis present

## 2022-08-30 DIAGNOSIS — I10 Essential (primary) hypertension: Secondary | ICD-10-CM | POA: Diagnosis not present

## 2022-08-30 DIAGNOSIS — N442 Benign cyst of testis: Secondary | ICD-10-CM | POA: Diagnosis present

## 2022-08-30 DIAGNOSIS — E785 Hyperlipidemia, unspecified: Secondary | ICD-10-CM | POA: Diagnosis present

## 2022-08-30 DIAGNOSIS — Z743 Need for continuous supervision: Secondary | ICD-10-CM | POA: Diagnosis not present

## 2022-08-30 DIAGNOSIS — N492 Inflammatory disorders of scrotum: Principal | ICD-10-CM | POA: Diagnosis present

## 2022-08-30 DIAGNOSIS — B91 Sequelae of poliomyelitis: Secondary | ICD-10-CM | POA: Diagnosis not present

## 2022-08-30 DIAGNOSIS — Z79899 Other long term (current) drug therapy: Secondary | ICD-10-CM | POA: Diagnosis not present

## 2022-08-30 DIAGNOSIS — R6889 Other general symptoms and signs: Secondary | ICD-10-CM | POA: Diagnosis not present

## 2022-08-30 DIAGNOSIS — Z66 Do not resuscitate: Secondary | ICD-10-CM | POA: Diagnosis present

## 2022-08-30 DIAGNOSIS — Z79890 Hormone replacement therapy (postmenopausal): Secondary | ICD-10-CM

## 2022-08-30 DIAGNOSIS — N319 Neuromuscular dysfunction of bladder, unspecified: Secondary | ICD-10-CM | POA: Diagnosis present

## 2022-08-30 DIAGNOSIS — R609 Edema, unspecified: Secondary | ICD-10-CM | POA: Diagnosis not present

## 2022-08-30 DIAGNOSIS — D696 Thrombocytopenia, unspecified: Secondary | ICD-10-CM | POA: Diagnosis not present

## 2022-08-30 DIAGNOSIS — N400 Enlarged prostate without lower urinary tract symptoms: Secondary | ICD-10-CM | POA: Diagnosis present

## 2022-08-30 HISTORY — DX: Benign prostatic hyperplasia without lower urinary tract symptoms: N40.0

## 2022-08-30 HISTORY — DX: Hypothyroidism, unspecified: E03.9

## 2022-08-30 HISTORY — DX: Hyperlipidemia, unspecified: E78.5

## 2022-08-30 HISTORY — DX: Essential (primary) hypertension: I10

## 2022-08-30 LAB — CBC WITH DIFFERENTIAL/PLATELET
Abs Immature Granulocytes: 0.04 10*3/uL (ref 0.00–0.07)
Basophils Absolute: 0 10*3/uL (ref 0.0–0.1)
Basophils Relative: 0 %
Eosinophils Absolute: 0.1 10*3/uL (ref 0.0–0.5)
Eosinophils Relative: 1 %
HCT: 35.6 % — ABNORMAL LOW (ref 39.0–52.0)
Hemoglobin: 11.3 g/dL — ABNORMAL LOW (ref 13.0–17.0)
Immature Granulocytes: 0 %
Lymphocytes Relative: 14 %
Lymphs Abs: 1.7 10*3/uL (ref 0.7–4.0)
MCH: 29.7 pg (ref 26.0–34.0)
MCHC: 31.7 g/dL (ref 30.0–36.0)
MCV: 93.4 fL (ref 80.0–100.0)
Monocytes Absolute: 1 10*3/uL (ref 0.1–1.0)
Monocytes Relative: 9 %
Neutro Abs: 8.6 10*3/uL — ABNORMAL HIGH (ref 1.7–7.7)
Neutrophils Relative %: 76 %
Platelets: 146 10*3/uL — ABNORMAL LOW (ref 150–400)
RBC: 3.81 MIL/uL — ABNORMAL LOW (ref 4.22–5.81)
RDW: 15 % (ref 11.5–15.5)
WBC: 11.4 10*3/uL — ABNORMAL HIGH (ref 4.0–10.5)
nRBC: 0 % (ref 0.0–0.2)

## 2022-08-30 LAB — URINALYSIS, ROUTINE W REFLEX MICROSCOPIC
Bilirubin Urine: NEGATIVE
Glucose, UA: NEGATIVE mg/dL
Ketones, ur: NEGATIVE mg/dL
Nitrite: POSITIVE — AB
Protein, ur: NEGATIVE mg/dL
Specific Gravity, Urine: 1.005 (ref 1.005–1.030)
pH: 6 (ref 5.0–8.0)

## 2022-08-30 LAB — BASIC METABOLIC PANEL
Anion gap: 9 (ref 5–15)
BUN: 18 mg/dL (ref 8–23)
CO2: 26 mmol/L (ref 22–32)
Calcium: 8.7 mg/dL — ABNORMAL LOW (ref 8.9–10.3)
Chloride: 98 mmol/L (ref 98–111)
Creatinine, Ser: 1.17 mg/dL (ref 0.61–1.24)
GFR, Estimated: >60 mL/min (ref >60)
Glucose, Bld: 132 mg/dL — ABNORMAL HIGH (ref 70–99)
Potassium: 4.2 mmol/L (ref 3.5–5.1)
Sodium: 133 mmol/L — ABNORMAL LOW (ref 135–145)

## 2022-08-30 LAB — C-REACTIVE PROTEIN: CRP: 5.4 mg/dL — ABNORMAL HIGH (ref <1.0)

## 2022-08-30 LAB — LACTIC ACID, PLASMA
Lactic Acid, Venous: 1.1 mmol/L (ref 0.5–1.9)
Lactic Acid, Venous: 1.5 mmol/L (ref 0.5–1.9)

## 2022-08-30 MED ORDER — VANCOMYCIN HCL 2000 MG/400ML IV SOLN
2000.0000 mg | Freq: Once | INTRAVENOUS | Status: AC
  Administered 2022-08-30: 2000 mg via INTRAVENOUS
  Filled 2022-08-30: qty 400

## 2022-08-30 MED ORDER — LIDOCAINE-EPINEPHRINE (PF) 2 %-1:200000 IJ SOLN
10.0000 mL | Freq: Once | INTRAMUSCULAR | Status: AC
  Administered 2022-08-30: 10 mL via INTRADERMAL
  Filled 2022-08-30: qty 20

## 2022-08-30 MED ORDER — ONDANSETRON HCL 4 MG PO TABS: 4.0000 mg | ORAL_TABLET | Freq: Four times a day (QID) | ORAL | Status: DC | PRN

## 2022-08-30 MED ORDER — LACTATED RINGERS IV BOLUS
1000.0000 mL | Freq: Once | INTRAVENOUS | Status: AC
  Administered 2022-08-30: 1000 mL via INTRAVENOUS

## 2022-08-30 MED ORDER — ROSUVASTATIN CALCIUM 5 MG PO TABS: 5.0000 mg | ORAL_TABLET | Freq: Every day | ORAL | Status: DC

## 2022-08-30 MED ORDER — ACETAMINOPHEN 325 MG PO TABS: 650.0000 mg | ORAL_TABLET | Freq: Four times a day (QID) | ORAL | Status: DC | PRN

## 2022-08-30 MED ORDER — LEVOTHYROXINE SODIUM 75 MCG PO TABS: 75.0000 ug | ORAL_TABLET | Freq: Every day | ORAL | Status: DC

## 2022-08-30 MED ORDER — SENNOSIDES-DOCUSATE SODIUM 8.6-50 MG PO TABS: 1.0000 | ORAL_TABLET | Freq: Every evening | ORAL | Status: DC | PRN

## 2022-08-30 MED ORDER — ONDANSETRON HCL 4 MG/2ML IJ SOLN: 4.0000 mg | Freq: Four times a day (QID) | INTRAMUSCULAR | Status: DC | PRN

## 2022-08-30 MED ORDER — DICYCLOMINE HCL 10 MG PO CAPS: 10.0000 mg | ORAL_CAPSULE | Freq: Once | ORAL | Status: DC

## 2022-08-30 MED ORDER — ACETAMINOPHEN 500 MG PO TABS
1000.0000 mg | ORAL_TABLET | Freq: Once | ORAL | Status: AC
  Administered 2022-08-30: 1000 mg via ORAL
  Filled 2022-08-30: qty 2

## 2022-08-30 MED ORDER — CEFAZOLIN SODIUM-DEXTROSE 2-4 GM/100ML-% IV SOLN
2.0000 g | Freq: Three times a day (TID) | INTRAVENOUS | Status: DC
  Administered 2022-08-31: 2 g via INTRAVENOUS
  Filled 2022-08-30 (×2): qty 100

## 2022-08-30 MED ORDER — ENOXAPARIN SODIUM 40 MG/0.4ML IJ SOSY
40.0000 mg | PREFILLED_SYRINGE | INTRAMUSCULAR | Status: DC
Start: 2022-08-31 — End: 2022-09-01
  Administered 2022-08-31: 40 mg via SUBCUTANEOUS
  Filled 2022-08-30: qty 0.4

## 2022-08-30 MED ORDER — IRBESARTAN 150 MG PO TABS
150.0000 mg | ORAL_TABLET | Freq: Every day | ORAL | Status: DC
  Administered 2022-08-31 – 2022-09-01 (×2): 150 mg via ORAL
  Filled 2022-08-30 (×2): qty 1

## 2022-08-30 MED ORDER — HYDROCHLOROTHIAZIDE 12.5 MG PO TABS
12.5000 mg | ORAL_TABLET | Freq: Every day | ORAL | Status: DC
  Administered 2022-08-31 – 2022-09-01 (×2): 12.5 mg via ORAL
  Filled 2022-08-30 (×2): qty 1

## 2022-08-30 MED ORDER — CLINDAMYCIN PHOSPHATE 600 MG/50ML IV SOLN
600.0000 mg | Freq: Three times a day (TID) | INTRAVENOUS | Status: DC
  Administered 2022-08-30 (×2): 600 mg via INTRAVENOUS
  Filled 2022-08-30 (×2): qty 50

## 2022-08-30 MED ORDER — HYDROCODONE-ACETAMINOPHEN 5-325 MG PO TABS
1.0000 | ORAL_TABLET | ORAL | Status: DC | PRN
  Administered 2022-08-31: 1 via ORAL
  Filled 2022-08-30: qty 1

## 2022-08-30 MED ORDER — VALSARTAN-HYDROCHLOROTHIAZIDE 160-12.5 MG PO TABS: 1.0000 | ORAL_TABLET | Freq: Every day | ORAL | Status: DC

## 2022-08-30 MED ORDER — ACETAMINOPHEN 650 MG RE SUPP: 650.0000 mg | Freq: Four times a day (QID) | RECTAL | Status: DC | PRN

## 2022-08-30 NOTE — ED Notes (Signed)
Nurse performed an in and out cath on pt, output .

## 2022-08-30 NOTE — ED Provider Notes (Signed)
[place] EMERGENCY DEPARTMENT AT [facility] Provider Note   CSN: [id number] Arrival date & time: 08/30/22  1404     History  Chief Complaint  Patient presents with   Testicle Pain    Brent Kirk is a 88 y.o. male with HTN, HLD, hypothyroidism, thrombocytopenia who presents with right testicular pain.   Pt was doing yard work a couple of days ago, when lifting, he felt a "pop" in right scrotum, states he feels like an infection is draining. +Pain in R scrotum and states the swelling is getting bigger. Has h/o L orchiectomy after trauma in high school. Denies any f/c, abdominal pain, N/V/D/C. He self-caths TID at baseline for BPH, no issues with that, does sometimes urinate through his urethra as well. Denies any penile pain, perineal pain, rectal pain. Has had infections in his scrotum before but never required an I&D.   Testicle Pain       Home Medications Prior to Admission medications   Medication Sig Start Date End Date Taking? Authorizing Provider  acetaminophen (TYLENOL) 500 MG tablet Take 500-1,000 mg by mouth every 6 (six) hours as needed for mild pain or headache.    [provider]  cephALEXin (KEFLEX) 500 MG capsule Take 1 capsule (500 mg total) by mouth 2 (two) times daily. 10/14/21   Dorcas Carrow, MD  levothyroxine (SYNTHROID) 75 MCG tablet Take 75 mcg by mouth daily before breakfast.    [provider]  rosuvastatin (CRESTOR) 5 MG tablet Take 5 mg by mouth daily.    [provider]  valsartan-hydrochlorothiazide (DIOVAN-HCT) 160-12.5 MG tablet Take 1 tablet by mouth daily.    [provider]      Allergies    Patient has no known allergies.    Review of Systems   Review of Systems  Genitourinary:  Positive for testicular pain.   Review of systems Negative for penile or abdominal pain.  A 10 point review of systems was performed and is negative unless otherwise reported in HPI.  Physical Exam Updated Vital  Signs BP (!) 155/74   Pulse 77   Temp 99.2 F (37.3 C) (Oral)   Resp 16   Ht 5\' 11"  (1.803 m)   Wt 102.1 kg   SpO2 95%   BMI 31.38 kg/m  Physical Exam General: Normal appearing male, lying in bed.  HEENT: PERRLA, Sclera anicteric, MMM, trachea midline.  Cardiology: RRR, no murmurs/rubs/gallops.  Resp: Normal respiratory rate and effort. CTAB, no wheezes, rhonchi, crackles.  Abd: Soft, non-tender, non-distended. No rebound tenderness or guarding.  GU: Normal appearing circumcised penis. R scrotum erythematous and edematous, no palpated crepitus. 4x4cm palpated area of fluctuance/induration with draining sanguinous purulent drainage out of pinpoint area in the center. +TTP. No TTP to testicle or epididymis, no testicular masses or hernias palpated. Normal testicular lie. No testicle in left hemiscrotum. MSK: 1+ peripheral edema bilaterally up to knees. Extremities without deformity or TTP. No cyanosis or clubbing. Skin: warm, dry.  Neuro: A&Ox4, CNs II-XII grossly intact. MAEs. Sensation grossly intact.  Psych: Normal mood and affect.   ED Results / Procedures / Treatments   Labs (all labs ordered are listed, but only abnormal results are displayed) Labs Reviewed  CBC WITH DIFFERENTIAL/PLATELET - Abnormal; Notable for the following components:      Result Value   WBC 11.4 (*)    RBC 3.81 (*)    Hemoglobin 11.3 (*)    HCT 35.6 (*)    Platelets 146 (*)  Neutro Abs 8.6 (*)    All other components within normal limits  BASIC METABOLIC PANEL - Abnormal; Notable for the following components:   Sodium 133 (*)    Glucose, Bld 132 (*)    Calcium 8.7 (*)    All other components within normal limits  CULTURE, BLOOD (ROUTINE X 2)  CULTURE, BLOOD (ROUTINE X 2)  LACTIC ACID, PLASMA  LACTIC ACID, PLASMA  URINALYSIS, ROUTINE W REFLEX MICROSCOPIC  C-REACTIVE PROTEIN    EKG None  Radiology US SCROTUM W/DOPPLER  Result Date: 08/30/2022 CLINICAL DATA:  Scrotal pain. EXAM: SCROTAL  ULTRASOUND DOPPLER ULTRASOUND OF THE TESTICLES TECHNIQUE: Complete ultrasound examination of the testicles, epididymis, and other scrotal structures was performed. Color and spectral Doppler ultrasound were also utilized to evaluate blood flow to the testicles. COMPARISON:  August 10, 2011. FINDINGS: Right testicle Measurements: 3.4 x 2.5 x 1.9 cm. No mass or microlithiasis visualized. Status post left orchiectomy. Right epididymis:  Normal in size and appearance. Hydrocele: Small right hydrocele is noted. 2.5 x 1.9 x 1.7 cm cyst is noted adjacent to right testicle. Varicocele:  None visualized. Pulsed Doppler interrogation of right testicle demonstrates normal low resistance arterial and venous waveforms bilaterally. Moderate scrotal wall thickening is noted. IMPRESSION: Status post left orchiectomy. No evidence of right testicular torsion or mass. 2.5 cm simple cyst is seen adjacent to right testicle which may represent spermatocele. Small right hydrocele is noted. Moderate scrotal wall thickening is noted. Electronically Signed   By: Lupita Raider M.D.   On: 08/30/2022 15:40    Procedures .Marland KitchenIncision and Drainage  Date/Time: 08/30/2022 8:44 PM  Performed by: Loetta Rough, MD Authorized by: Loetta Rough, MD   Consent:    Consent obtained:  Verbal   Consent given by:  Patient   Risks discussed:  Bleeding, damage to other organs, infection, incomplete drainage and pain   Alternatives discussed:  No treatment, delayed treatment and referral Universal protocol:    Procedure explained and questions answered to patient or proxy's satisfaction: yes     Relevant documents present and verified: yes     Test results available : yes     Imaging studies available: yes     Site/side marked: yes     Patient identity confirmed:  Verbally with patient Location:    Type:  Abscess   Size:  2.5 cm   Location:  Anogenital   Anogenital location:  Scrotal wall Pre-procedure details:    Skin preparation:   Chlorhexidine Sedation:    Sedation type:  None Anesthesia:    Anesthesia method:  Local infiltration   Local anesthetic:  Lidocaine 2% WITH epi Procedure type:    Complexity:  Simple Procedure details:    Ultrasound guidance: no     Incision types:  Single straight   Incision depth:  Subcutaneous   Wound management:  Probed and deloculated   Drainage:  Bloody and purulent   Drainage amount:  Moderate   Packing materials:  1/4 in iodoform gauze   Amount 1/4" iodoform:  10 cm Post-procedure details:    Procedure completion:  Tolerated well, no immediate complications     Medications Ordered in ED Medications  clindamycin (CLEOCIN) IVPB 600 mg (0 mg Intravenous Stopped 08/30/22 1832)  acetaminophen (TYLENOL) tablet 1,000 mg (1,000 mg Oral Given 08/30/22 1556)  lactated ringers bolus 1,000 mL (0 mLs Intravenous Stopped 08/30/22 2029)  lidocaine-EPINEPHrine (XYLOCAINE W/EPI) 2 %-1:200000 (PF) injection 10 mL (10 mLs Intradermal Given 08/30/22 1826)  ED Course/ Medical Decision Making/ A&P                          Medical Decision Making Amount and/or Complexity of Data Reviewed Labs: ordered. Decision-making details documented in ED Course. Radiology:  Decision-making details documented in ED Course.  Risk Prescription drug management. Decision regarding hospitalization.    This patient presents to the ED for concern of scrotal pain/swelling/drainage, this involves an extensive number of treatment options, and is a complaint that carries with it a high risk of complications and morbidity.    MDM:    Exam demonstrates scrotal cellulitis and abscess. Patient presents febrile to 100.5 F, had been unaware that he was febrile. Testicular/scrotal ultrasound doesn't demonstrate any torsion or hernias, just a small R hydrocele and a soft tissue simple cystic structure known on exam to represent abscess, mild scrotal wall thickening. Must consider fournier's gangrene with infection  in the GU area. No ecchymosis noted. LRINEC score 3 without CRP, which is pending. Patient not diabetic, has had scrotal infections before, and overall his pain is minimal. Has no TTP or symptoms of his perineum, only local scrotal symptoms over the last several days, overall c/f fournier's is low at this time. Will give IV clindamycin and I&D abscess.   Clinical Course as of 08/30/22 2117  Thu Aug 30, 2022  1631 US SCROTUM W/DOPPLER Status post left orchiectomy.  No evidence of right testicular torsion or mass. 2.5 cm simple cyst is seen adjacent to right testicle which may represent spermatocele. Small right hydrocele is noted.  Moderate scrotal wall thickening is noted.   [HN]  1632 WBC(!): 11.4 +Leukocytosis [HN]  1632 Hemoglobin(!): 11.3 At approximate baseline, possibly mildly volume contracted [HN]  1632 Lactic Acid, Venous: 1.1 wnl [HN]  1632 Temp(!): 100.5 F (38.1 C) [HN]  1632 W/ increased temp, leukocytosis, and source of infection, must consider sepsis. Will draw blood cultures and give IV abx for cellulitis.  [HN]  2115 Scrotal abscess I&D'd. Will consult to medicine for overnight obs and abx. [HN]    Clinical Course User Index [HN] Loetta Rough, MD    Labs: I Ordered, and personally interpreted labs.  The pertinent results include:  those listed above  Imaging Studies ordered: I ordered imaging studies including US scrotum w/ doppler I independently visualized and interpreted imaging. I agree with the radiologist interpretation  Additional history obtained from chart review.    Cardiac Monitoring: The patient was maintained on a cardiac monitor.  I personally viewed and interpreted the cardiac monitored which showed an underlying rhythm of: NSR  Reevaluation: After the interventions noted above, I reevaluated the patient and found that they have :improved  Social Determinants of Health: Patient lives independently   Disposition:  Admit  Co  morbidities that complicate the patient evaluation  Past Medical History:  Diagnosis Date   Cervical spondylosis without myelopathy    Late effects of acute poliomyelitis    Lumbago    Thoracic spondylosis without myelopathy      Medicines Meds ordered this encounter  Medications   acetaminophen (TYLENOL) tablet 1,000 mg   lactated ringers bolus 1,000 mL   clindamycin (CLEOCIN) IVPB 600 mg    Order Specific Question:   Antibiotic Indication:    Answer:   Cellulitis   lidocaine-EPINEPHrine (XYLOCAINE W/EPI) 2 %-1:200000 (PF) injection 10 mL    I have reviewed the patients home medicines and have made adjustments as needed  Problem List / ED Course: Problem List Items Addressed This Visit   None Visit Diagnoses     Scrotal wall abscess    -  Primary                   This note was created using dictation software, which may contain spelling or grammatical errors.    Loetta Rough, MD 08/31/22 [phone]

## 2022-08-30 NOTE — Hospital Course (Signed)
Brent Kirk is a 88 y.o. male with medical history significant for HTN, HLD, hypothyroidism, BPH who self caths who is admitted with cellulitis of the scrotum with abscess s/p I&D.

## 2022-08-30 NOTE — ED Notes (Signed)
Attempted in and out cath x1, reports "this sometimes happens just wait a little and try again".

## 2022-08-30 NOTE — ED Triage Notes (Signed)
Pt was doing yard work yesterday, when lifting, he felt a "pop" in right scrotum, states he feels like something is leaking internally. 142/78 BP 88 HR 98% room air 131 cbg

## 2022-08-30 NOTE — H&P (Addendum)
History and Physical    ROI Brent Kirk:[id number] DOB: [birth year] DOA: 08/30/2022  PCP: Emilio Aspen, MD  Patient coming from: Home  I have personally briefly reviewed patient's old medical records in [facility]  Chief Complaint: Right scrotal pain  HPI: Brent Kirk is a 88 y.o. male with medical history significant for HTN, HLD, hypothyroidism, BPH who self caths who presented to the ED for evaluation of right scrotal pain.  Patient states that he was working in the yard when he developed pain in his right scrotum.  He felt a "pop" sensation.  He says he has had infections involving the scrotum in the past and this is similar.  He states that he chronically self caths at baseline but does sometimes void on his own.  He has not had any dysuria or urethral discharge.  Patient reports history of left orchiectomy many years ago after traumatic injury when he was hit by a baseball.  ED Course  Labs/Imaging on admission: I have personally reviewed following labs and imaging studies.  Initial vitals showed BP 155/81, pulse 93, RR 18, temp 100.5 F, SpO2 100% on room air.  Labs show WBC 11.4, hemoglobin 11.3, platelets 146,000, sodium 133, potassium 4.2, bicarb 26, BUN 18, creatinine 1.17, serum glucose 132, lactic acid 1.5.  Blood cultures in process.  Urinalysis pending.  Scrotal ultrasound obtained and shows prior left orchiectomy.  No evidence of right testicular torsion or mass.  A 2.5 cm simple cyst seen adjacent to the right testicle which may represent spermatocele.  Small right hydrocele noted.  Moderate scrotal wall thickening noted.  Patient was given 1 L LR and IV clindamycin.  EDP performed I&D to right scrotal wall abscess with reported purulent drainage.  The hospitalist service was consulted to admit for further evaluation and management.  Review of Systems: All systems reviewed and are negative except as documented in history of present illness  above.   Past Medical History:  Diagnosis Date   Cervical spondylosis without myelopathy    Late effects of acute poliomyelitis    Lumbago    Thoracic spondylosis without myelopathy     No past surgical history on file.  Social History:  reports that he has never smoked. He does not have any smokeless tobacco history on file. He reports that he does not drink alcohol and does not use drugs.  No Known Allergies  No family history on file.   Prior to Admission medications   Medication Sig Start Date End Date Taking? Authorizing Provider  acetaminophen (TYLENOL) 500 MG tablet Take 500-1,000 mg by mouth every 6 (six) hours as needed for mild pain or headache.    [provider]  cephALEXin (KEFLEX) 500 MG capsule Take 1 capsule (500 mg total) by mouth 2 (two) times daily. 10/14/21   Dorcas Carrow, MD  levothyroxine (SYNTHROID) 75 MCG tablet Take 75 mcg by mouth daily before breakfast.    [provider]  rosuvastatin (CRESTOR) 5 MG tablet Take 5 mg by mouth daily.    [provider]  valsartan-hydrochlorothiazide (DIOVAN-HCT) 160-12.5 MG tablet Take 1 tablet by mouth daily.    [provider]    Physical Exam: Vitals:   08/30/22 1600 08/30/22 1900 08/30/22 2228 08/30/22 2306  BP:  (!) 155/74  (!) 170/73  Pulse: 88 77  77  Resp:  16  19  Temp:   97.9 F (36.6 C) 98.5 F (36.9 C)  TempSrc:   Oral  SpO2: 94% 95%  100%  Weight:      Height:       Constitutional: NAD, calm, comfortable Eyes: EOMI, lids and conjunctivae normal ENMT: Mucous membranes are moist. Posterior pharynx clear of any exudate or lesions.Normal dentition.  Neck: normal, supple, no masses. Respiratory: clear to auscultation bilaterally, no wheezing, no crackles. Normal respiratory effort. No accessory muscle use.  Cardiovascular: Regular rate and rhythm, no murmurs / rubs / gallops. No extremity edema. 2+ pedal pulses. Abdomen: no tenderness, no masses palpated.  GU:  Erythema and swelling of the right scrotum.  S/p I&D with packing material in place. Musculoskeletal: no clubbing / cyanosis. No joint deformity upper and lower extremities. Good ROM. Skin: no rashes, lesions, ulcers. No induration Neurologic: . Sensation intact. Strength 5/5 in all 4.  Psychiatric: .Alert and oriented x 3. Normal mood.   EKG: Not performed.  Assessment/Plan Principal Problem:   Cellulitis of scrotum Active Problems:   HTN (hypertension)   HLD (hyperlipidemia)   Hypothyroidism   Brent Kirk is a 88 y.o. male with medical history significant for HTN, HLD, hypothyroidism, BPH who self caths who is admitted with cellulitis of the scrotum with abscess s/p I&D.  Assessment and Plan: Scrotal cellulitis with abscess: Right-sided scrotal cellulitis with abscess s/p I&D by EDP with purulent discharge, packing material placed.  Mild fever and leukocytosis but not meeting sepsis criteria on admission.  Scrotal ultrasound negative for torsion or mass, s/p left orchiectomy changes noted.  Small right hydrocele and 2.5 cm simple cyst adjacent to the right testicle also noted. -Continue antibiotics with IV vancomycin and Ancef -Follow blood cultures and urinalysis  Hypertension: Continue valsartan-HCTZ.  Hypothyroidism: Continue Synthroid.  Hyperlipidemia: Continue statin.  BPH: Patient reports self cathing at home.   DVT prophylaxis: enoxaparin (LOVENOX) injection 40 mg Start: 08/31/22 2200 Code Status: DNR, confirmed with patient on admission.  ACP documents reviewed. Family Communication: Discussed with patient, he has discussed with family Disposition Plan: From home and likely discharge to home pending clinical progress Consults called: None Severity of Illness: The appropriate patient status for this patient is OBSERVATION. Observation status is judged to be reasonable and necessary in order to provide the required intensity of service to ensure the patient's  safety. The patient's presenting symptoms, physical exam findings, and initial radiographic and laboratory data in the context of their medical condition is felt to place them at decreased risk for further clinical deterioration. Furthermore, it is anticipated that the patient will be medically stable for discharge from the hospital within 2 midnights of admission.   Darreld Mclean MD [facility]  If 7PM-7AM, please contact night-coverage [email or web address]  08/30/2022, 11:07 PM

## 2022-08-30 NOTE — Progress Notes (Signed)
Pharmacy Antibiotic Note  Brent Kirk is a 88 y.o. male admitted on 08/30/2022 with scrotal cellulitis.  S/p I&D in ED with purulent discharge.  Pharmacy has been consulted for Vancomycin dosing.  Renal fxn at patient's baseline.   Plan: Ancef per MD Vancomycin 2gm IV x1 then  IV q24h to target AUC 400-550. Estimated AUC on this regimen: 540 Monitor renal function and cx data    Height:  (180.3 cm) Weight: 102.1 kg (225 lb) IBW/kg (Calculated) : 75.3  Temp (24hrs), Avg:99 F (37.2 C), Min:97.9 F (36.6 C), Max:100.5 F (38.1 C)  Recent Labs  Lab 08/30/22 1451 08/30/22 1734  WBC 11.4*  --   CREATININE 1.17  --   LATICACIDVEN 1.1 1.5    Estimated Creatinine Clearance: 56.1 mL/min (by C-G formula based on SCr of 1.17 mg/dL).    No Known Allergies  Antimicrobials this admission: 4/25 Clindamycin >> 4/25 4/25 Vancomycin >>  4/26 Ancef >>  Dose adjustments this admission:  Microbiology results: 4/25 BCx:   Thank you for allowing pharmacy to be a part of this patient's care.  Junita Push PharmD 08/30/2022 11:51 PM

## 2022-08-31 DIAGNOSIS — N492 Inflammatory disorders of scrotum: Secondary | ICD-10-CM | POA: Diagnosis not present

## 2022-08-31 DIAGNOSIS — E785 Hyperlipidemia, unspecified: Secondary | ICD-10-CM | POA: Diagnosis present

## 2022-08-31 DIAGNOSIS — N433 Hydrocele, unspecified: Secondary | ICD-10-CM | POA: Diagnosis present

## 2022-08-31 DIAGNOSIS — Z79890 Hormone replacement therapy (postmenopausal): Secondary | ICD-10-CM | POA: Diagnosis not present

## 2022-08-31 DIAGNOSIS — E039 Hypothyroidism, unspecified: Secondary | ICD-10-CM | POA: Diagnosis present

## 2022-08-31 DIAGNOSIS — Z79899 Other long term (current) drug therapy: Secondary | ICD-10-CM | POA: Diagnosis not present

## 2022-08-31 DIAGNOSIS — N442 Benign cyst of testis: Secondary | ICD-10-CM | POA: Diagnosis present

## 2022-08-31 DIAGNOSIS — N319 Neuromuscular dysfunction of bladder, unspecified: Secondary | ICD-10-CM | POA: Diagnosis present

## 2022-08-31 DIAGNOSIS — Z66 Do not resuscitate: Secondary | ICD-10-CM | POA: Diagnosis present

## 2022-08-31 DIAGNOSIS — N400 Enlarged prostate without lower urinary tract symptoms: Secondary | ICD-10-CM | POA: Diagnosis present

## 2022-08-31 DIAGNOSIS — D696 Thrombocytopenia, unspecified: Secondary | ICD-10-CM | POA: Diagnosis present

## 2022-08-31 DIAGNOSIS — B91 Sequelae of poliomyelitis: Secondary | ICD-10-CM | POA: Diagnosis not present

## 2022-08-31 DIAGNOSIS — I10 Essential (primary) hypertension: Secondary | ICD-10-CM | POA: Diagnosis present

## 2022-08-31 LAB — CBC
HCT: 31.6 % — ABNORMAL LOW (ref 39.0–52.0)
Hemoglobin: 10.1 g/dL — ABNORMAL LOW (ref 13.0–17.0)
MCH: 29.8 pg (ref 26.0–34.0)
MCHC: 32 g/dL (ref 30.0–36.0)
MCV: 93.2 fL (ref 80.0–100.0)
Platelets: 136 10*3/uL — ABNORMAL LOW (ref 150–400)
RBC: 3.39 MIL/uL — ABNORMAL LOW (ref 4.22–5.81)
RDW: 14.9 % (ref 11.5–15.5)
WBC: 8.1 10*3/uL (ref 4.0–10.5)
nRBC: 0 % (ref 0.0–0.2)

## 2022-08-31 LAB — BASIC METABOLIC PANEL
Anion gap: 10 (ref 5–15)
BUN: 16 mg/dL (ref 8–23)
CO2: 25 mmol/L (ref 22–32)
Calcium: 8.5 mg/dL — ABNORMAL LOW (ref 8.9–10.3)
Chloride: 101 mmol/L (ref 98–111)
Creatinine, Ser: 1.12 mg/dL (ref 0.61–1.24)
GFR, Estimated: >60 mL/min (ref >60)
Glucose, Bld: 139 mg/dL — ABNORMAL HIGH (ref 70–99)
Potassium: 3.9 mmol/L (ref 3.5–5.1)
Sodium: 136 mmol/L (ref 135–145)

## 2022-08-31 MED ORDER — VANCOMYCIN HCL 1250 MG/250ML IV SOLN
1250.0000 mg | INTRAVENOUS | Status: DC
  Administered 2022-08-31: 1250 mg via INTRAVENOUS
  Filled 2022-08-31: qty 250

## 2022-08-31 MED ORDER — VANCOMYCIN HCL 1250 MG/250ML IV SOLN: 1250.0000 mg | INTRAVENOUS | Status: DC

## 2022-08-31 MED ORDER — SODIUM CHLORIDE 0.9 % IV SOLN
3.0000 g | Freq: Four times a day (QID) | INTRAVENOUS | Status: DC
  Administered 2022-08-31 – 2022-09-01 (×5): 3 g via INTRAVENOUS
  Filled 2022-08-31 (×6): qty 8

## 2022-08-31 NOTE — Plan of Care (Signed)
  Problem: Clinical Measurements: Goal: Ability to avoid or minimize complications of infection will improve Outcome: Not Progressing   Problem: Skin Integrity: Goal: Skin integrity will improve Outcome: Not Progressing   Problem: Education: Goal: Knowledge of General Education information will improve Description: Including pain rating scale, medication(s)/side effects and non-pharmacologic comfort measures Outcome: Not Progressing   Problem: Health Behavior/Discharge Planning: Goal: Ability to manage health-related needs will improve Outcome: Not Progressing   Problem: Clinical Measurements: Goal: Ability to maintain clinical measurements within normal limits will improve Outcome: Not Progressing Goal: Will remain free from infection Outcome: Not Progressing Goal: Diagnostic test results will improve Outcome: Not Progressing Goal: Respiratory complications will improve Outcome: Not Progressing Goal: Cardiovascular complication will be avoided Outcome: Not Progressing   Problem: Activity: Goal: Risk for activity intolerance will decrease Outcome: Not Progressing   Problem: Nutrition: Goal: Adequate nutrition will be maintained Outcome: Not Progressing   Problem: Coping: Goal: Level of anxiety will decrease Outcome: Not Progressing   Problem: Elimination: Goal: Will not experience complications related to bowel motility Outcome: Not Progressing Goal: Will not experience complications related to urinary retention Outcome: Not Progressing   Problem: Pain Managment: Goal: General experience of comfort will improve Outcome: Not Progressing   Problem: Safety: Goal: Ability to remain free from injury will improve Outcome: Not Progressing   Problem: Skin Integrity: Goal: Risk for impaired skin integrity will decrease Outcome: Not Progressing

## 2022-08-31 NOTE — Progress Notes (Signed)
  Transition of Care [facility]) Screening Note   Patient Details  Name: Brent Kirk Date of Birth: [birth year]   Transition of Care [facility]) CM/SW Contact:    Otelia Santee, LCSW Phone Number: 08/31/2022, 10:05 AM    Transition of Care Department [facility]) has reviewed patient and no TOC needs have been identified at this time. We will continue to monitor patient advancement through interdisciplinary progression rounds. If new patient transition needs arise, please place a TOC consult.

## 2022-08-31 NOTE — Progress Notes (Addendum)
PROGRESS NOTE    Brent Kirk  ZOX:[id number] DOB: [birth year] DOA: 08/30/2022 PCP: Emilio Aspen, MD  Outpatient Specialists:     Brief Narrative:  Patient is an 88 year old male with past medical history significant for hypertension, hyperlipidemia, hypothyroidism and BPH.  Patient was admitted with scrotal wall abscess/cellulitis.  Patient is currently on IV Unasyn and vancomycin.  Urology team has been consulted (Dr. Marlou Porch).   Assessment & Plan:   Principal Problem:   Cellulitis of scrotum Active Problems:   HTN (hypertension)   HLD (hyperlipidemia)   Hypothyroidism   Scrotal cellulitis with abscess: -Right-sided scrotal cellulitis with abscess s/p I&D by EDP with purulent discharge, packing material placed.   -On presentation, mild fever and leukocytosis but not meeting sepsis criteria.  -Scrotal ultrasound negative for torsion or mass, s/p left orchiectomy changes noted.  Small right hydrocele and 2.5 cm simple cyst adjacent to the right testicle also noted. -Patient is currently on IV Unasyn and vancomycin. -Urology team has been consulted.      Hypertension: Continue valsartan-HCTZ. Continue to optimize.  Goal blood pressure should be less than 130/80 mmHg.   Hypothyroidism: Continue Synthroid.   Hyperlipidemia: Continue statin.   BPH: Patient reports self cathing at home.  Possible UTI: -Urine culture (if not already done). -Continue antibiotics.    DVT prophylaxis: Subcutaneous Lovenox. Code Status: DO NOT RESUSCITATE. Family Communication:  Disposition Plan: This will depend on hospital course.   Consultants:  Urology.  Procedures:  Status post I&D by the ER team.  Antimicrobials:  IV Unasyn. IV vancomycin.   Subjective: Scrotal abscess.  Objective: Vitals:   08/30/22 2306 08/31/22 0333 08/31/22 0745 08/31/22 1131  BP: (!) 170/73 126/79 137/64 (!) 157/74  Pulse: 77 83 81 78  Resp: 19 18 18 18   Temp: 98.5 F (36.9 C)  98.9 F (37.2 C) 98.6 F (37 C) 97.6 F (36.4 C)  TempSrc:   Oral Oral  SpO2: 100% 97% 96% 97%  Weight:      Height:        Intake/Output Summary (Last 24 hours) at 08/31/2022 1446 Last data filed at 08/31/2022 1005 Gross per 24 hour  Intake --  Output 2550 ml  Net -2550 ml   Filed Weights   08/30/22 1414  Weight: 102.1 kg    Examination:  General exam: Appears calm and comfortable  Respiratory system: Clear to auscultation.  Cardiovascular system: S1 & S2 heard Gastrointestinal system: Abdomen is soft and nontender.  Central nervous system: Alert and oriented.  Extremities: Fullness of the ankle/mild edema. Pelvic: Scrotal wall abscess/skin changes.   Data Reviewed: I have personally reviewed following labs and imaging studies  CBC: Recent Labs  Lab 08/30/22 1451 08/31/22 0553  WBC 11.4* 8.1  NEUTROABS 8.6*  --   HGB 11.3* 10.1*  HCT 35.6* 31.6*  MCV 93.4 93.2  PLT 146* 136*   Basic Metabolic Panel: Recent Labs  Lab 08/30/22 1451 08/31/22 0553  NA 133* 136  K 4.2 3.9  CL 98 101  CO2 26 25  GLUCOSE 132* 139*  BUN 18 16  CREATININE 1.17 1.12  CALCIUM 8.7* 8.5*   GFR: Estimated Creatinine Clearance: 58.7 mL/min (by C-G formula based on SCr of 1.12 mg/dL). Liver Function Tests: No results for input(s): "AST", "ALT", "ALKPHOS", "BILITOT", "PROT", "ALBUMIN" in the last 168 hours. No results for input(s): "LIPASE", "AMYLASE" in the last 168 hours. No results for input(s): "AMMONIA" in the last 168 hours. Coagulation Profile: No results for input(s): "  INR", "PROTIME" in the last 168 hours. Cardiac Enzymes: No results for input(s): "CKTOTAL", "CKMB", "CKMBINDEX", "TROPONINI" in the last 168 hours. BNP (last 3 results) No results for input(s): "PROBNP" in the last 8760 hours. HbA1C: No results for input(s): "HGBA1C" in the last 72 hours. CBG: No results for input(s): "GLUCAP" in the last 168 hours. Lipid Profile: No results for input(s): "CHOL",  "HDL", "LDLCALC", "TRIG", "CHOLHDL", "LDLDIRECT" in the last 72 hours. Thyroid Function Tests: No results for input(s): "TSH", "T4TOTAL", "FREET4", "T3FREE", "THYROIDAB" in the last 72 hours. Anemia Panel: No results for input(s): "VITAMINB12", "FOLATE", "FERRITIN", "TIBC", "IRON", "RETICCTPCT" in the last 72 hours. Urine analysis:    Component Value Date/Time   COLORURINE YELLOW 08/30/2022 2258   APPEARANCEUR CLEAR 08/30/2022 2258   LABSPEC 1.005 08/30/2022 2258   PHURINE 6.0 08/30/2022 2258   GLUCOSEU NEGATIVE 08/30/2022 2258   HGBUR SMALL (A) 08/30/2022 2258   BILIRUBINUR NEGATIVE 08/30/2022 2258   KETONESUR NEGATIVE 08/30/2022 2258   PROTEINUR NEGATIVE 08/30/2022 2258   NITRITE POSITIVE (A) 08/30/2022 2258   LEUKOCYTESUR MODERATE (A) 08/30/2022 2258   Sepsis Labs: @LABRCNTIP (procalcitonin:4,lacticidven:4)  ) Recent Results (from the past 240 hour(s))  Blood culture (routine x 2)     Status: None (Preliminary result)   Collection Time: 08/30/22  5:31 PM   Specimen: BLOOD  Result Value Ref Range Status   Specimen Description   Final    BLOOD RIGHT ANTECUBITAL Performed at [facility], [address], [place], [place] [address]    Special Requests   Final    BOTTLES DRAWN AEROBIC AND ANAEROBIC Blood Culture adequate volume Performed at [facility], [address] [place], [place] [address]    Culture   Final    NO GROWTH < 12 HOURS Performed at [facility], [address] [place], [place] [address]    Report Status PENDING  Incomplete  Blood culture (routine x 2)     Status: None (Preliminary result)   Collection Time: 08/30/22  5:52 PM   Specimen: BLOOD  Result Value Ref Range Status   Specimen Description   Final    BLOOD LEFT ANTECUBITAL Performed at [facility], [address] [place], [place] [address]    Special Requests   Final    BOTTLES DRAWN AEROBIC AND ANAEROBIC Blood Culture results  may not be optimal due to an excessive volume of blood received in culture bottles Performed at [facility], [address], [place], [place] [address]    Culture   Final    NO GROWTH < 12 HOURS Performed at [facility], [address], [place], [place] [address]    Report Status PENDING  Incomplete         Radiology Studies: US SCROTUM W/DOPPLER  Result Date: 08/30/2022 CLINICAL DATA:  Scrotal pain. EXAM: SCROTAL ULTRASOUND DOPPLER ULTRASOUND OF THE TESTICLES TECHNIQUE: Complete ultrasound examination of the testicles, epididymis, and other scrotal structures was performed. Color and spectral Doppler ultrasound were also utilized to evaluate blood flow to the testicles. COMPARISON:  August 10, 2011. FINDINGS: Right testicle Measurements: 3.4 x 2.5 x 1.9 cm. No mass or microlithiasis visualized. Status post left orchiectomy. Right epididymis:  Normal in size and appearance. Hydrocele: Small right hydrocele is noted. 2.5 x 1.9 x 1.7 cm cyst is noted adjacent to right testicle. Varicocele:  None visualized. Pulsed Doppler interrogation of right testicle demonstrates normal low resistance arterial and venous waveforms bilaterally. Moderate  scrotal wall thickening is noted. IMPRESSION: Status post left orchiectomy. No evidence of right testicular torsion or mass. 2.5 cm simple cyst is seen adjacent to right testicle which may represent spermatocele. Small right hydrocele is noted. Moderate scrotal wall thickening is noted. Electronically Signed   By: Lupita Raider M.D.   On: 08/30/2022 15:40        Scheduled Meds:  enoxaparin (LOVENOX) injection  40 mg Subcutaneous Q24H   irbesartan  150 mg Oral Daily   And   hydrochlorothiazide  12.5 mg Oral Daily   Continuous Infusions:  ampicillin-sulbactam (UNASYN) IV Stopped (08/31/22 1035)   vancomycin       LOS: 0 days    Time spent: 55 minutes.    Berton Mount, MD  [facility] Pager #: [phone] 7PM-7AM contact night coverage as above

## 2022-09-01 DIAGNOSIS — N492 Inflammatory disorders of scrotum: Secondary | ICD-10-CM | POA: Diagnosis not present

## 2022-09-01 LAB — CREATININE, SERUM
Creatinine, Ser: 1.18 mg/dL (ref 0.61–1.24)
GFR, Estimated: >60 mL/min (ref >60)

## 2022-09-01 LAB — URINE CULTURE: Culture: NO GROWTH

## 2022-09-01 MED ORDER — SULFAMETHOXAZOLE-TRIMETHOPRIM 800-160 MG PO TABS: 1.0000 | ORAL_TABLET | Freq: Two times a day (BID) | ORAL | 0 refills | Status: AC

## 2022-09-01 MED ORDER — SENNOSIDES-DOCUSATE SODIUM 8.6-50 MG PO TABS: 1.0000 | ORAL_TABLET | Freq: Every evening | ORAL | 0 refills | Status: AC | PRN

## 2022-09-01 MED ORDER — HYDROCODONE-ACETAMINOPHEN 5-325 MG PO TABS: 1.0000 | ORAL_TABLET | ORAL | 0 refills | Status: AC | PRN

## 2022-09-01 NOTE — Progress Notes (Addendum)
Pharmacy Antibiotic Note  Brent Kirk is a 88 y.o. male admitted on 08/30/2022 with scrotal cellulitis.  S/p I&D in ED with purulent discharge.  Pharmacy has been consulted for Vancomycin dosing.  Renal fxn at patient's baseline.   Plan: - Continue 1250mg  IV every 24 hours (estimated AUC: 545) - Measure Vancomycin levels as needed. Goal AUC: 400-550 - Follow up renal function, culture results, and clinical course.  Height: 5\' 11"  (180.3 cm) Weight: 102.1 kg (225 lb) IBW/kg (Calculated) : 75.3  Temp (24hrs), Avg:98 F (36.7 C), Min:97.6 F (36.4 C), Max:98.8 F (37.1 C)  Recent Labs  Lab 08/30/22 1451 08/30/22 1734 08/31/22 0553 09/01/22 0643  WBC 11.4*  --  8.1  --   CREATININE 1.17  --  1.12 1.18  LATICACIDVEN 1.1 1.5  --   --      Estimated Creatinine Clearance: 55.7 mL/min (by C-G formula based on SCr of 1.18 mg/dL).    No Known Allergies  Antimicrobials this admission: 4/25 Clindamycin >> 4/25 4/26 Cefazolin >> 4/26 4/25 Vancomycin >>  4/26 Unasyn >>  Dose adjustments this admission:  Microbiology results: 4/25 BCx: ngtd 4/26 Urine Cx: sent   Thank you for allowing pharmacy to be a part of this patient's care.  Josefa Half PharmD 09/01/2022 11:12 AM

## 2022-09-01 NOTE — Consult Note (Signed)
I have been asked to see the patient by Dr. Berton Mount, for evaluation and management of scrotal abscess.  History of present illness: 88 year old male presented to the emergency department 2 days prior with draining infection in the right hemiscrotum.  Evidently, he has had history of scrotal abscesses in the past.  In the emergency department he is complaining of pain but no fevers or chills.  In the emergency room the emergency room physician performed an I&D of the abscess and then packed it with iodoform gauze.  He was then placed on Augmentin, vancomycin, and admitted to the hospital.  I was consulted for further evaluation.  The patient has a past urologic history significant for a neurogenic bladder.  He catheterizes himself 3 times daily.  He has been doing this for over 20 years.  He has been seen and evaluated once in our office, he was last seen in August 2023.  The patient also has a history of solitary testicle, had orchiectomy at the age of 62 following an injury during a softball game.  Currently the patient is complaining of no pain.  He has been afebrile with stable vital signs.  Review of systems: A 12 point comprehensive review of systems was obtained and is negative unless otherwise stated in the history of present illness.  Patient Active Problem List   Diagnosis Date Noted   Abscess of scrotal wall 08/31/2022   Cellulitis of scrotum 08/30/2022   UTI (urinary tract infection) 10/12/2021   Acute metabolic encephalopathy 10/12/2021   Diarrhea 10/12/2021   Anemia 10/12/2021   Thrombocytopenia (HCC) 10/12/2021   HTN (hypertension) 10/12/2021   HLD (hyperlipidemia) 10/12/2021   Hypothyroidism 10/12/2021   Sepsis (HCC) 10/11/2021    No current facility-administered medications on file prior to encounter.   Current Outpatient Medications on File Prior to Encounter  Medication Sig Dispense Refill   valsartan-hydrochlorothiazide (DIOVAN-HCT) 160-12.5 MG tablet Take 1  tablet by mouth daily.      Past Medical History:  Diagnosis Date   BPH (benign prostatic hyperplasia)    Cervical spondylosis without myelopathy    HLD (hyperlipidemia)    HTN (hypertension)    Hypothyroid    Late effects of acute poliomyelitis    Lumbago    Thoracic spondylosis without myelopathy     History reviewed. No pertinent surgical history.  Social History   Tobacco Use   Smoking status: Never  Vaping Use   Vaping Use: Never used  Substance Use Topics   Alcohol use: No   Drug use: No    Family History  Family history unknown: Yes    PE: Vitals:   08/31/22 0745 08/31/22 1131 08/31/22 2000 09/01/22 0450  BP: 137/64 (!) 157/74 (!) 129/50 121/68  Pulse: 81 78 85 70  Resp: 18 18 18 18   Temp: 98.6 F (37 C) 97.6 F (36.4 C) 98.8 F (37.1 C) 97.6 F (36.4 C)  TempSrc: Oral Oral Oral   SpO2: 96% 97% 95% 95%  Weight:      Height:       Patient appears to be in no acute distress  patient is alert and oriented x3 Atraumatic normocephalic head No cervical or supraclavicular lymphadenopathy appreciated No increased work of breathing, no audible wheezes/rhonchi Regular sinus rhythm/rate Abdomen is soft, nontender, nondistended, no CVA or suprapubic tenderness The patient has some wet 4 inch Kerlix gauze around the incision.  There is no packing remaining within the scrotal incision.  There is no drainage.  The area  is firm, but there is no induration or real drainage.  I probed the area with a cotton-tipped probe and most of the area had been close down.  There was no purulence drained.  The area was mildly tender but there was no significant pain. lower extremities are symmetric without appreciable edema Grossly neurologically intact No identifiable skin lesions  Recent Labs    08/30/22 1451 08/31/22 0553  WBC 11.4* 8.1  HGB 11.3* 10.1*  HCT 35.6* 31.6*   Recent Labs    08/30/22 1451 08/31/22 0553 09/01/22 0643  NA 133* 136  --   K 4.2 3.9  --    CL 98 101  --   CO2 26 25  --   GLUCOSE 132* 139*  --   BUN 18 16  --   CREATININE 1.17 1.12 1.18  CALCIUM 8.7* 8.5*  --    No results for input(s): "LABPT", "INR" in the last 72 hours. No results for input(s): "LABURIN" in the last 72 hours. Results for orders placed or performed during the hospital encounter of 08/30/22  Blood culture (routine x 2)     Status: None (Preliminary result)   Collection Time: 08/30/22  5:31 PM   Specimen: BLOOD  Result Value Ref Range Status   Specimen Description   Final    BLOOD RIGHT ANTECUBITAL Performed at [facility], [address] [place], [place] [address]    Special Requests   Final    BOTTLES DRAWN AEROBIC AND ANAEROBIC Blood Culture adequate volume Performed at [facility], [address], [place], [place] [address]    Culture   Final    NO GROWTH 2 DAYS Performed at [facility], [address] [place], [place] [address]    Report Status PENDING  Incomplete  Blood culture (routine x 2)     Status: None (Preliminary result)   Collection Time: 08/30/22  5:52 PM   Specimen: BLOOD  Result Value Ref Range Status   Specimen Description   Final    BLOOD LEFT ANTECUBITAL Performed at [facility], [address], [place], [place] [address]    Special Requests   Final    BOTTLES DRAWN AEROBIC AND ANAEROBIC Blood Culture results may not be optimal due to an excessive volume of blood received in culture bottles Performed at [facility], [address], [place], [place] [address]    Culture   Final    NO GROWTH 2 DAYS Performed at [facility], [address], [place], [place] [address]    Report Status PENDING  Incomplete    Imaging: none  Imp:  The patient has a right hemiscrotal abscess that was I&D 2 days prior.  He is now been on IV antibiotics for 2 days and the abscess cavity is closing down and there is no significant drainage.  I did  not leave any packing, but did leave some gauze around the incision. The patient has a history of neurogenic bladder and catheterizes himself 3 times daily.  Recommendations: I recommend the patient be discharged home with 5 days of Bactrim.  He can keep gauze over the incision while it still draining.  We will follow-up with him in clinic for a wound check next week. The patient will continue to perform clean intermittent catheterization as he has been doing for 20 years without difficulty or problem.  Thank you for involving me in this patient's care, please page with  any further questions or concerns. Crist Fat

## 2022-09-01 NOTE — Discharge Summary (Signed)
Physician Discharge Summary  Patient ID: KRISTY CATOE MRN: [id number] DOB/AGE: 86-Dec-1936 86 y.o.  Admit date: 08/30/2022 Discharge date: 09/01/2022  Admission Diagnoses:  Discharge Diagnoses:  Principal Problem:   Cellulitis/abscess of scrotum Active Problems:   HTN (hypertension)   HLD (hyperlipidemia)   Hypothyroidism   Abscess of scrotal wall   Discharged Condition: stable  Hospital Course: Patient is an 88 year old male with past medical history significant for hypertension, hyperlipidemia, hypothyroidism and BPH. Patient was admitted with scrotal wall abscess/cellulitis. Patient underwent I&D by the ER provider.  Patient was admitted for further assessment and management.  During the hospital stay, patient was treated with IV Unasyn and vancomycin.  Urology team was consulted.  Urology team is cleared patient for discharge.  Urology team is advised treating patient with Bactrim DS 1 tablet twice daily for 5 days.  Patient will follow-up with a primary care provider and urology team on discharge.  Blood culture and urine cultures are still pending.  UA was positive for nitrate and many bacteria.    Scrotal cellulitis with abscess: -Right-sided scrotal cellulitis with abscess/purulent discharge s/p I&D by emergency room provider. -Wound was packed.     -On presentation, mild fever and leukocytosis but not meeting sepsis criteria.  -Scrotal ultrasound negative for torsion or mass, s/p left orchiectomy changes noted.  Small right hydrocele and 2.5 cm simple cyst adjacent to the right testicle also noted. -Patient was treated with IV Unasyn and vancomycin. -Blood cultures pending. -Urology team was consulted.   -Urology team has cleared patient for discharge.  Patient will complete 5-day course of Bactrim DS.  Continue wound care.  Continue to pack the wound.  Follow-up with primary care provider and urologist on discharge.     Hypertension: Continue home antihypertensives  (valsartan-HCTZ). Continue to optimize.   Goal blood pressure should be less than 130/80 mmHg.   Hypothyroidism: Continue Synthroid.   Hyperlipidemia: Continue statin.   BPH: Patient reports self cathing at home.   Possible UTI: -Urine culture is pending. -Urinalysis was positive for many bacteria and nitrate.   -Continue antibiotics. -PCP to follow final culture results.  Consults: urology.  Patient was seen by urology prior to discharge.  Urology team has recommended Bactrim DS for 5 days.  Significant Diagnostic Studies:  Ultrasound of the scrotum with Doppler ultrasound of the testicle was revealed: Status post left orchiectomy.   No evidence of right testicular torsion or mass. 2.5 cm simple cyst is seen adjacent to right testicle which may represent spermatocele. Small right hydrocele is noted.   Moderate scrotal wall thickening is noted.  Blood and urine cultures are still pending.  Treatments: Patient was treated with antibiotics.  Discharge Exam: Blood pressure 121/68, pulse 70, temperature 97.6 F (36.4 C), resp. rate 18, height 5\' 11"  (1.803 m), weight 102.1 kg, SpO2 95 %.   Disposition: Discharge disposition: 01-Home or Self Care       Discharge Instructions     Diet - low sodium heart healthy   Complete by: As directed    Increase activity slowly   Complete by: As directed       Allergies as of 09/01/2022   No Known Allergies      Medication List     TAKE these medications    HYDROcodone-acetaminophen 5-325 MG tablet Commonly known as: NORCO/VICODIN Take 1-2 tablets by mouth every 4 (four) hours as needed for moderate pain.   senna-docusate 8.6-50 MG tablet Commonly known as: Senokot-S Take 1 tablet by  mouth at bedtime as needed for up to 7 days for mild constipation.   sulfamethoxazole-trimethoprim 800-160 MG tablet Commonly known as: BACTRIM DS Take 1 tablet by mouth 2 (two) times daily for 5 days.    valsartan-hydrochlorothiazide 160-12.5 MG tablet Commonly known as: DIOVAN-HCT Take 1 tablet by mouth daily.        Time spent: 35 minutes.  SignedBarnetta Chapel 09/01/2022, 11:13 AM

## 2022-09-04 LAB — CULTURE, BLOOD (ROUTINE X 2)
Culture: NO GROWTH
Culture: NO GROWTH
Special Requests: ADEQUATE

## 2022-12-11 DIAGNOSIS — R339 Retention of urine, unspecified: Secondary | ICD-10-CM | POA: Diagnosis not present

## 2023-01-04 IMAGING — DX DG CHEST 1V PORT
1 series · 1 of 1 positions shown · non-contrast
Comparison: 07/08/2014

CLINICAL DATA: Shortness of breath and sepsis.

EXAM:
PORTABLE CHEST 1 VIEW

[chest]
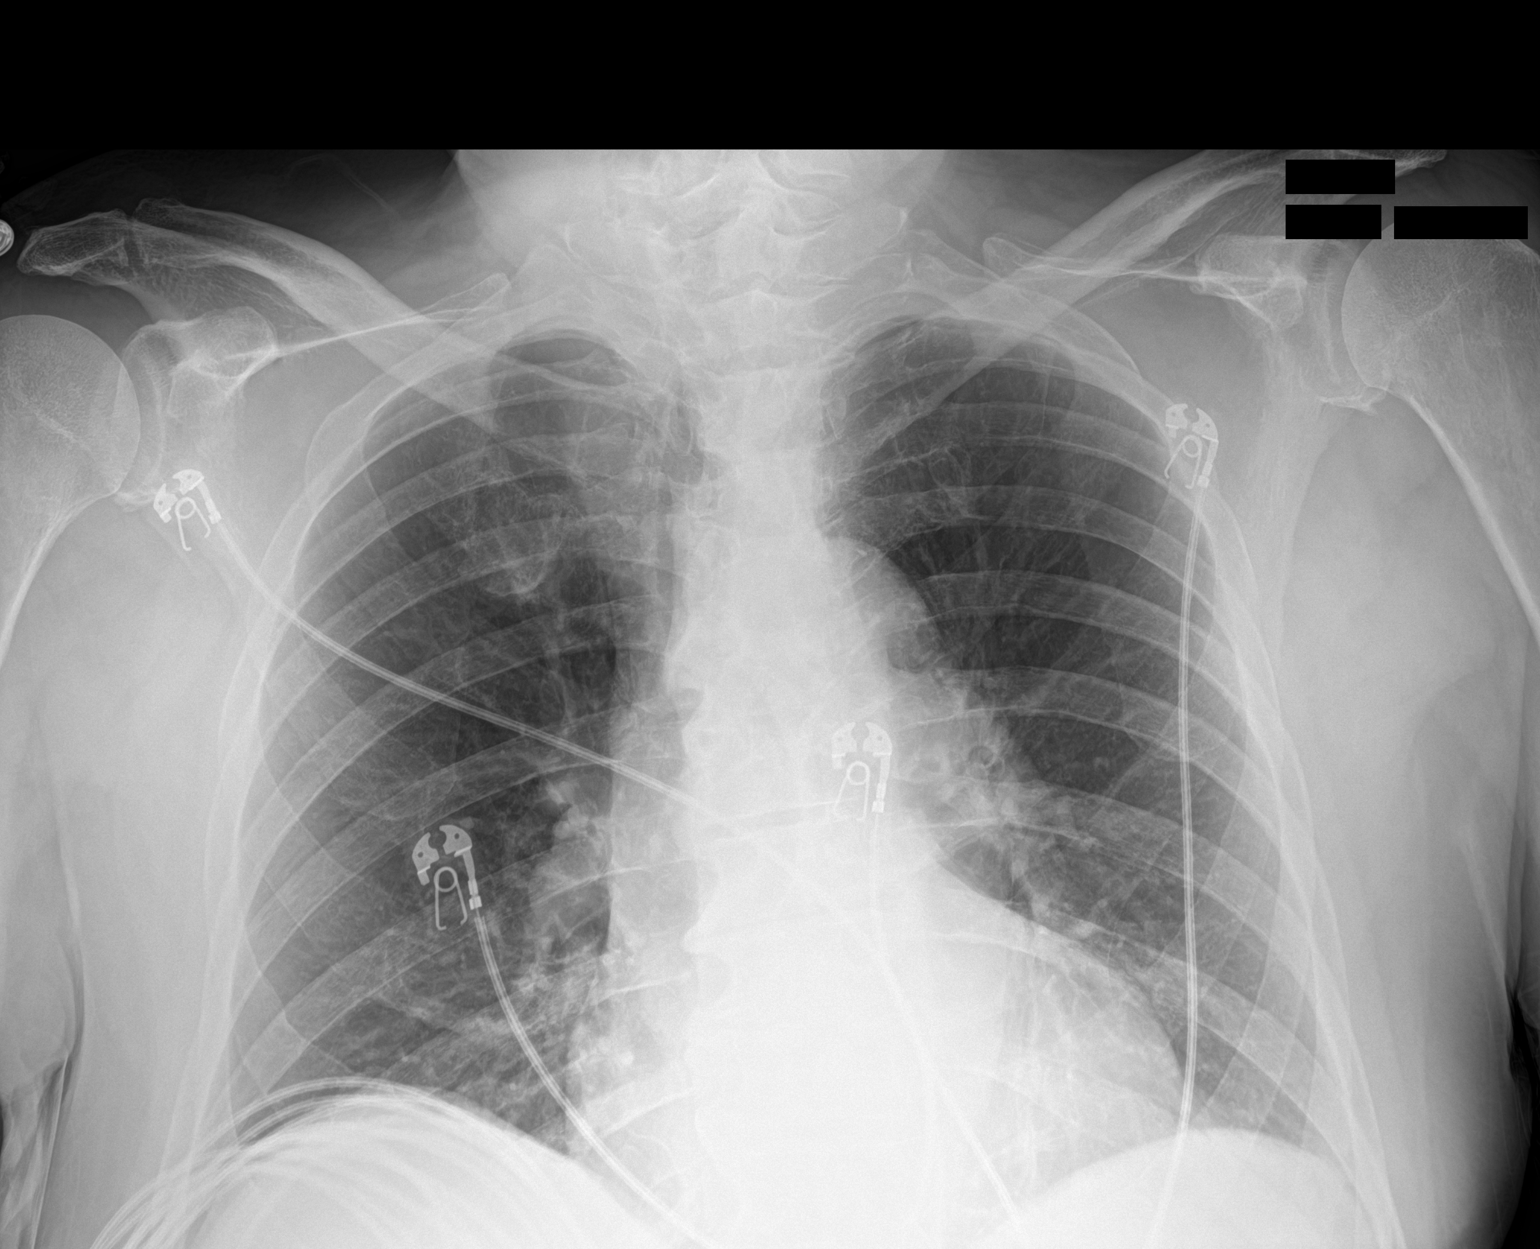

[1 of 1 positions shown; findings below may reference images not displayed]

FINDINGS: The heart size and mediastinal contours are within normal limits.
There is no evidence of pulmonary edema, consolidation, pneumothorax
or pleural fluid. The visualized skeletal structures are
unremarkable.
IMPRESSION: No active disease.

## 2023-03-04 DIAGNOSIS — R339 Retention of urine, unspecified: Secondary | ICD-10-CM | POA: Diagnosis not present

## 2023-04-08 DIAGNOSIS — E039 Hypothyroidism, unspecified: Secondary | ICD-10-CM | POA: Diagnosis not present

## 2023-04-08 DIAGNOSIS — E7800 Pure hypercholesterolemia, unspecified: Secondary | ICD-10-CM | POA: Diagnosis not present

## 2023-04-08 DIAGNOSIS — I10 Essential (primary) hypertension: Secondary | ICD-10-CM | POA: Diagnosis not present

## 2023-04-08 DIAGNOSIS — Z5181 Encounter for therapeutic drug level monitoring: Secondary | ICD-10-CM | POA: Diagnosis not present

## 2023-05-14 DIAGNOSIS — D649 Anemia, unspecified: Secondary | ICD-10-CM | POA: Diagnosis not present

## 2023-05-20 DIAGNOSIS — D649 Anemia, unspecified: Secondary | ICD-10-CM | POA: Diagnosis not present

## 2023-05-24 DIAGNOSIS — R339 Retention of urine, unspecified: Secondary | ICD-10-CM | POA: Diagnosis not present

## 2023-05-24 DIAGNOSIS — N3000 Acute cystitis without hematuria: Secondary | ICD-10-CM | POA: Diagnosis not present

## 2023-05-29 DIAGNOSIS — D649 Anemia, unspecified: Secondary | ICD-10-CM | POA: Diagnosis not present

## 2023-05-31 DIAGNOSIS — N3000 Acute cystitis without hematuria: Secondary | ICD-10-CM | POA: Diagnosis not present

## 2023-05-31 DIAGNOSIS — R339 Retention of urine, unspecified: Secondary | ICD-10-CM | POA: Diagnosis not present

## 2023-06-03 DIAGNOSIS — E039 Hypothyroidism, unspecified: Secondary | ICD-10-CM | POA: Diagnosis not present

## 2023-06-03 DIAGNOSIS — D649 Anemia, unspecified: Secondary | ICD-10-CM | POA: Diagnosis not present

## 2023-06-14 DIAGNOSIS — R339 Retention of urine, unspecified: Secondary | ICD-10-CM | POA: Diagnosis not present

## 2023-07-03 DIAGNOSIS — I10 Essential (primary) hypertension: Secondary | ICD-10-CM | POA: Diagnosis not present

## 2023-07-03 DIAGNOSIS — Z0000 Encounter for general adult medical examination without abnormal findings: Secondary | ICD-10-CM | POA: Diagnosis not present

## 2023-07-03 DIAGNOSIS — D696 Thrombocytopenia, unspecified: Secondary | ICD-10-CM | POA: Diagnosis not present

## 2023-07-03 DIAGNOSIS — E039 Hypothyroidism, unspecified: Secondary | ICD-10-CM | POA: Diagnosis not present

## 2023-07-03 DIAGNOSIS — Z23 Encounter for immunization: Secondary | ICD-10-CM | POA: Diagnosis not present

## 2023-07-03 DIAGNOSIS — D649 Anemia, unspecified: Secondary | ICD-10-CM | POA: Diagnosis not present

## 2023-07-03 DIAGNOSIS — R195 Other fecal abnormalities: Secondary | ICD-10-CM | POA: Diagnosis not present

## 2023-08-07 DIAGNOSIS — R8271 Bacteriuria: Secondary | ICD-10-CM | POA: Diagnosis not present

## 2023-08-07 DIAGNOSIS — N3000 Acute cystitis without hematuria: Secondary | ICD-10-CM | POA: Diagnosis not present

## 2023-09-10 DIAGNOSIS — N3000 Acute cystitis without hematuria: Secondary | ICD-10-CM | POA: Diagnosis not present

## 2023-09-10 DIAGNOSIS — R339 Retention of urine, unspecified: Secondary | ICD-10-CM | POA: Diagnosis not present

## 2023-11-04 DIAGNOSIS — E7849 Other hyperlipidemia: Secondary | ICD-10-CM | POA: Diagnosis not present

## 2023-11-04 DIAGNOSIS — I10 Essential (primary) hypertension: Secondary | ICD-10-CM | POA: Diagnosis not present

## 2023-11-04 DIAGNOSIS — E039 Hypothyroidism, unspecified: Secondary | ICD-10-CM | POA: Diagnosis not present

## 2023-12-31 DIAGNOSIS — R195 Other fecal abnormalities: Secondary | ICD-10-CM | POA: Diagnosis not present

## 2023-12-31 DIAGNOSIS — D696 Thrombocytopenia, unspecified: Secondary | ICD-10-CM | POA: Diagnosis not present

## 2023-12-31 DIAGNOSIS — K5900 Constipation, unspecified: Secondary | ICD-10-CM | POA: Diagnosis not present

## 2023-12-31 DIAGNOSIS — N329 Bladder disorder, unspecified: Secondary | ICD-10-CM | POA: Diagnosis not present

## 2023-12-31 DIAGNOSIS — D649 Anemia, unspecified: Secondary | ICD-10-CM | POA: Diagnosis not present

## 2023-12-31 DIAGNOSIS — R413 Other amnesia: Secondary | ICD-10-CM | POA: Diagnosis not present

## 2023-12-31 DIAGNOSIS — I10 Essential (primary) hypertension: Secondary | ICD-10-CM | POA: Diagnosis not present

## 2023-12-31 DIAGNOSIS — E039 Hypothyroidism, unspecified: Secondary | ICD-10-CM | POA: Diagnosis not present

## 2024-01-14 DIAGNOSIS — E538 Deficiency of other specified B group vitamins: Secondary | ICD-10-CM | POA: Diagnosis not present

## 2024-01-14 DIAGNOSIS — R413 Other amnesia: Secondary | ICD-10-CM | POA: Diagnosis not present

## 2024-01-14 DIAGNOSIS — K5900 Constipation, unspecified: Secondary | ICD-10-CM | POA: Diagnosis not present

## 2024-01-14 DIAGNOSIS — D649 Anemia, unspecified: Secondary | ICD-10-CM | POA: Diagnosis not present

## 2024-02-03 DIAGNOSIS — E538 Deficiency of other specified B group vitamins: Secondary | ICD-10-CM | POA: Diagnosis not present

## 2024-02-11 ENCOUNTER — Other Ambulatory Visit: Payer: Self-pay | Admitting: Nurse Practitioner

## 2024-02-11 DIAGNOSIS — D649 Anemia, unspecified: Secondary | ICD-10-CM | POA: Diagnosis not present

## 2024-02-11 DIAGNOSIS — R195 Other fecal abnormalities: Secondary | ICD-10-CM

## 2024-02-12 DIAGNOSIS — E538 Deficiency of other specified B group vitamins: Secondary | ICD-10-CM | POA: Diagnosis not present

## 2024-02-14 ENCOUNTER — Ambulatory Visit
Admission: RE | Admit: 2024-02-14 | Discharge: 2024-02-14 | Disposition: A | Discharge status: Home / Self Care | Source: Ambulatory Visit | Attending: Nurse Practitioner | Admitting: Nurse Practitioner

## 2024-02-14 DIAGNOSIS — N281 Cyst of kidney, acquired: Secondary | ICD-10-CM | POA: Diagnosis not present

## 2024-02-14 DIAGNOSIS — D649 Anemia, unspecified: Secondary | ICD-10-CM

## 2024-02-14 DIAGNOSIS — R195 Other fecal abnormalities: Secondary | ICD-10-CM

## 2024-02-14 MED ORDER — IOPAMIDOL (ISOVUE-300) INJECTION 61%
100.0000 mL | Freq: Once | INTRAVENOUS | Status: AC | PRN
Start: 2024-02-14 — End: 2024-02-14
  Administered 2024-02-14: 100 mL via INTRAVENOUS

## 2024-02-17 DIAGNOSIS — E538 Deficiency of other specified B group vitamins: Secondary | ICD-10-CM | POA: Diagnosis not present

## 2024-02-20 DIAGNOSIS — R338 Other retention of urine: Secondary | ICD-10-CM | POA: Diagnosis not present

## 2024-06-09 ENCOUNTER — Encounter: Payer: Self-pay | Admitting: Adult Health
